# Patient Record
Sex: Male | Born: 1995 | Race: Black or African American | Hispanic: No | Marital: Single | Smoking: Current every day smoker
Health system: Southern US, Community
[De-identification: ages and names within clinical notes are randomized; demographics above are authoritative.]

## PROBLEM LIST (undated history)

## (undated) DIAGNOSIS — J45909 Unspecified asthma, uncomplicated: Secondary | ICD-10-CM

## (undated) DIAGNOSIS — L309 Dermatitis, unspecified: Secondary | ICD-10-CM

## (undated) DIAGNOSIS — F909 Attention-deficit hyperactivity disorder, unspecified type: Secondary | ICD-10-CM

---
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
## 2016-03-06 ENCOUNTER — Emergency Department (HOSPITAL_COMMUNITY)
Admission: EM | Admit: 2016-03-06 | Discharge: 2016-03-06 | Disposition: A | Discharge status: Home / Self Care | Payer: Medicaid Other | Attending: Emergency Medicine | Admitting: Emergency Medicine

## 2016-03-06 ENCOUNTER — Encounter (HOSPITAL_COMMUNITY): Payer: Self-pay

## 2016-03-06 DIAGNOSIS — F909 Attention-deficit hyperactivity disorder, unspecified type: Secondary | ICD-10-CM | POA: Insufficient documentation

## 2016-03-06 DIAGNOSIS — J069 Acute upper respiratory infection, unspecified: Secondary | ICD-10-CM | POA: Insufficient documentation

## 2016-03-06 DIAGNOSIS — F17210 Nicotine dependence, cigarettes, uncomplicated: Secondary | ICD-10-CM | POA: Insufficient documentation

## 2016-03-06 DIAGNOSIS — J45909 Unspecified asthma, uncomplicated: Secondary | ICD-10-CM | POA: Insufficient documentation

## 2016-03-06 HISTORY — DX: Unspecified asthma, uncomplicated: J45.909

## 2016-03-06 HISTORY — DX: Dermatitis, unspecified: L30.9

## 2016-03-06 HISTORY — DX: Attention-deficit hyperactivity disorder, unspecified type: F90.9

## 2016-03-06 MED ORDER — BENZONATATE 100 MG PO CAPS: 200.0000 mg | ORAL_CAPSULE | Freq: Two times a day (BID) | ORAL | 0 refills | Status: DC | PRN

## 2016-03-06 MED ORDER — CETIRIZINE HCL 10 MG PO TABS: 10.0000 mg | ORAL_TABLET | Freq: Every day | ORAL | 1 refills | Status: DC

## 2016-03-06 NOTE — ED Notes (Signed)
Called PT in Lobby to take them to the room, no answer. Called Three Times.

## 2016-03-06 NOTE — ED Triage Notes (Signed)
Onset 3 days non productive cough- intermittant, sneezing, runny nose, backache.  No fever.  No respiratory difficulties. No one in household sick.

## 2016-03-06 NOTE — ED Provider Notes (Signed)
[facility] Provider Note   CSN: [id number] Arrival date & time: 03/06/16  1755  By signing my name below, I, Doreatha MartinEva Mathews, attest that this documentation has been prepared under the direction and in the presence of Roxy Horsemanobert Justeen Hehr, [name]. Electronically Signed: Doreatha MartinEva Mathews, ED Scribe. 03/06/16. 7:24 PM.    History   Chief Complaint Chief Complaint  Patient presents with  . URI    HPI Aaron Lynn is a 21 y.o. male who presents to the Emergency Department complaining of worsening non-productive cough for 3 days with associated congestion, mild back pain, sore throat, rhinorrhea. He has taken ibuprofen for his back pain with some relief, but no other OTC medications for his URI symptoms. Pt states he typically gets back pain when he is sick. No worsening factors noted. Pt denies fever, generalized body aches.   The history is provided by the patient. No language interpreter was used.    Past Medical History:  Diagnosis Date  . ADHD   . Asthma   . Eczema     There are no active problems to display for this patient.   History reviewed. No pertinent surgical history.     Home Medications    Prior to Admission medications   Medication Sig Start Date End Date Taking? Authorizing Provider  benzonatate (TESSALON) 100 MG capsule Take 2 capsules (200 mg total) by mouth 2 (two) times daily as needed for cough. 03/06/16   Roxy Horsemanobert Galan Ghee, [name]  cetirizine (ZYRTEC ALLERGY) 10 MG tablet Take 1 tablet (10 mg total) by mouth daily. 03/06/16   Roxy Horsemanobert Shanya Ferriss, [name]  hydrOXYzine (ATARAX/VISTARIL) 25 MG tablet Take 25 mg by mouth daily.    Historical Provider, MD  ibuprofen (ADVIL,MOTRIN) 600 MG tablet Take 1 tablet (600 mg total) by mouth every 6 (six) hours as needed for pain. 10/03/11   Derwood KaplanAnkit Nanavati, MD  lisdexamfetamine (VYVANSE) 70 MG capsule Take 70 mg by mouth daily.    Historical Provider, MD  mometasone (NASONEX) 50 MCG/ACT nasal spray Place 2 sprays into the nose daily.     Historical Provider, MD  montelukast (SINGULAIR) 10 MG tablet Take 10 mg by mouth at bedtime.    Historical Provider, MD  olopatadine (PATANOL) 0.1 % ophthalmic solution Place 1 drop into both eyes 2 (two) times daily.    Historical Provider, MD    Family History History reviewed. No pertinent family history.  Social History Social History  Substance Use Topics  . Smoking status: Current Some Day Smoker    Types: Cigarettes  . Smokeless tobacco: Never Used  . Alcohol use Yes     Comment: occ     Allergies   Patient has no known allergies.   Review of Systems Review of Systems  Constitutional: Negative for fever.  HENT: Positive for congestion, rhinorrhea and sore throat.   Respiratory: Positive for cough.   Musculoskeletal: Positive for back pain. Negative for myalgias.     Physical Exam Updated Vital Signs BP 107/77   Pulse 85   Temp 99.3 F (37.4 C) (Oral)   Ht 5\' 11"  (1.803 m)   Wt 191 lb 3.2 oz (86.7 kg)   SpO2 97%   BMI 26.67 kg/m   Physical Exam Physical Exam  Constitutional: Pt  is oriented to person, place, and time. Appears well-developed and well-nourished. No distress.  HENT:  Head: Normocephalic and atraumatic.  Right Ear: Tympanic membrane, external ear and ear canal normal.  Left Ear: Tympanic membrane, external ear and ear canal normal.  Nose: Mucosal edema and mild rhinorrhea present. No epistaxis. Right sinus exhibits no maxillary sinus tenderness and no frontal sinus tenderness. Left sinus exhibits no maxillary sinus tenderness and no frontal sinus tenderness.  Mouth/Throat: Uvula is midline and mucous membranes are normal. Mucous membranes are not pale and not cyanotic. No oropharyngeal exudate, posterior oropharyngeal edema, posterior oropharyngeal erythema or tonsillar abscesses.  Eyes: Conjunctivae are normal. Pupils are equal, round, and reactive to light.  Neck: Normal range of motion and full passive range of motion without pain.    Cardiovascular: Normal rate and intact distal pulses.   Pulmonary/Chest: Effort normal and breath sounds normal. No stridor.  Clear and equal breath sounds without focal wheezes, rhonchi, rales  Abdominal: Soft. Bowel sounds are normal. There is no tenderness.  Musculoskeletal: Normal range of motion.  Lymphadenopathy:    Pthas no cervical adenopathy.  Neurological: Pt is alert and oriented to person, place, and time.  Skin: Skin is warm and dry. No rash noted. Pt is not diaphoretic.  Psychiatric: Normal mood and affect.  Nursing note and vitals reviewed.    ED Treatments / Results   DIAGNOSTIC STUDIES: Oxygen Saturation is 97% on RA, normal by my interpretation.    COORDINATION OF CARE: 7:21 PM Discussed treatment plan with pt at bedside which includes symptomatic therapy and pt agreed to plan.    Labs (all labs ordered are listed, but only abnormal results are displayed) Labs Reviewed - No data to display  EKG  EKG Interpretation None       Radiology No results found.  Procedures Procedures (including critical care time)  Medications Ordered in ED Medications - No data to display   Initial Impression / Assessment and Plan / ED Course  I have reviewed the triage vital signs and the nursing notes.  Pertinent labs & imaging results that were available during my care of the patient were reviewed by me and considered in my medical decision making (see chart for details).     Pt symptoms consistent with URI. Pt will be discharged with symptomatic treatment, including antitussive, antihistamine.  Discussed return precautions.  Pt is hemodynamically stable & in NAD prior to discharge.   Afebrile, VSS.  States here for work note.  Final Clinical Impressions(s) / ED Diagnoses   Final diagnoses:  Upper respiratory tract infection, unspecified type    New Prescriptions New Prescriptions   BENZONATATE (TESSALON) 100 MG CAPSULE    Take 2 capsules (200 mg total)  by mouth 2 (two) times daily as needed for cough.   CETIRIZINE (ZYRTEC ALLERGY) 10 MG TABLET    Take 1 tablet (10 mg total) by mouth daily.    I personally performed the services described in this documentation, which was scribed in my presence. The recorded information has been reviewed and is accurate.      Roxy Horseman, [name] 03/06/16 1925    Cathren Laine, MD 03/06/16 [phone]

## 2016-03-06 NOTE — ED Notes (Signed)
Pt and family understood dc material. NAD noted 

## 2016-09-02 ENCOUNTER — Ambulatory Visit (HOSPITAL_COMMUNITY)
Admission: EM | Admit: 2016-09-02 | Discharge: 2016-09-02 | Disposition: A | Discharge status: Home / Self Care | Payer: Medicaid Other | Attending: Family | Admitting: Family

## 2016-09-02 ENCOUNTER — Encounter (HOSPITAL_COMMUNITY): Payer: Self-pay | Admitting: Emergency Medicine

## 2016-09-02 DIAGNOSIS — K529 Noninfective gastroenteritis and colitis, unspecified: Secondary | ICD-10-CM

## 2016-09-02 NOTE — Discharge Instructions (Signed)
As is discussed, suspect you either ate something last night late that has disagreed with you or you have a viral gastroenteritis. In either case, please stay very vigilant and ensure you do not have recurring diarrhea, vomiting, or start tohave abdominal pain, fever as all these signs would mea you need to come back for reevaluation.  As for today, please increase  diet as tolerated including bland foods as we discussed. Importance of increased fluids as well including Pedialyte, Gatorade, water  If there is no improvement in your symptoms, or if there is any worsening of symptoms, or if you have any additional concerns, please return for re-evaluation; or, if we are closed, consider going to the Emergency Room for evaluation if symptoms urgent.

## 2016-09-02 NOTE — ED Triage Notes (Signed)
Pt reports waking up with diarrhea early this morning.  He has had at least 5 episodes of diarrhea and 2 episodes of vomiting since 0730 this morning.  Pt states his girlfriend is suffering from the same symptoms.

## 2016-09-02 NOTE — ED Provider Notes (Signed)
[facility]    CSN: [id number] Arrival date & time: 09/02/16  1151     History   Chief Complaint Chief Complaint  Patient presents with  . Emesis  . Diarrhea    HPI Aaron Lynn is a 21 y.o. male.   Chief complaint of 2 episodes of diarrhea this morning, watery brown, improving. One episode of vomiting, undigested food. Nonbloody. Ate tacos ( homemade) yesterday. Pepto bismal with some relief.  No fever, abdominal pain, dysuria.   drinking well- water and juice.  Notes that girlfriend has similar symptoms.   No alcohol or drugs.         Past Medical History:  Diagnosis Date  . ADHD   . Asthma   . Eczema     There are no active problems to display for this patient.   History reviewed. No pertinent surgical history.     Home Medications    Prior to Admission medications   Medication Sig Start Date End Date Taking? Authorizing Provider  benzonatate (TESSALON) 100 MG capsule Take 2 capsules (200 mg total) by mouth 2 (two) times daily as needed for cough. 03/06/16   Roxy Horseman, [name]  cetirizine (ZYRTEC ALLERGY) 10 MG tablet Take 1 tablet (10 mg total) by mouth daily. 03/06/16   Roxy Horseman, [name]  hydrOXYzine (ATARAX/VISTARIL) 25 MG tablet Take 25 mg by mouth daily.    [provider]  ibuprofen (ADVIL,MOTRIN) 600 MG tablet Take 1 tablet (600 mg total) by mouth every 6 (six) hours as needed for pain. 10/03/11   Derwood Kaplan, MD  lisdexamfetamine (VYVANSE) 70 MG capsule Take 70 mg by mouth daily.    [provider]  mometasone (NASONEX) 50 MCG/ACT nasal spray Place 2 sprays into the nose daily.    [provider]  montelukast (SINGULAIR) 10 MG tablet Take 10 mg by mouth at bedtime.    [provider]  olopatadine (PATANOL) 0.1 % ophthalmic solution Place 1 drop into both eyes 2 (two) times daily.    [provider]    Family History History reviewed. No pertinent family history.  Social  History Social History  Substance Use Topics  . Smoking status: Current Some Day Smoker    Types: Cigarettes  . Smokeless tobacco: Never Used  . Alcohol use Yes     Comment: occ     Allergies   Patient has no known allergies.   Review of Systems Review of Systems  Constitutional: Negative for chills and fever.  Respiratory: Negative for cough.   Cardiovascular: Negative for chest pain and palpitations.  Gastrointestinal: Positive for diarrhea and vomiting. Negative for abdominal pain, constipation and nausea.  Genitourinary: Negative for difficulty urinating.     Physical Exam Triage Vital Signs ED Triage Vitals [09/02/16 1241]  Enc Vitals Group     BP 95/74     Pulse Rate 87     Resp      Temp 98.7 F (37.1 C)     Temp Source Oral     SpO2 98 %     Weight      Height      Head Circumference      Peak Flow      Pain Score      Pain Loc      Pain Edu?      Excl. in GC?    No data found.   Updated Vital Signs BP 95/74 (BP Location: Left Arm)   Pulse 87  Temp 98.7 F (37.1 C) (Oral)   SpO2 98%   Visual Acuity Right Eye Distance:   Left Eye Distance:   Bilateral Distance:    Right Eye Near:   Left Eye Near:    Bilateral Near:     Physical Exam  Constitutional: He appears well-developed and well-nourished.  Cardiovascular: Regular rhythm and normal heart sounds.   Pulmonary/Chest: Effort normal and breath sounds normal. No respiratory distress. He has no wheezes. He has no rhonchi. He has no rales.  Abdominal: There is no CVA tenderness.  No suprapubic tenderness.   Neurological: He is alert.  Skin: Skin is warm and dry.  Psychiatric: He has a normal mood and affect. His speech is normal and behavior is normal.  Vitals reviewed.    UC Treatments / Results  Labs (all labs ordered are listed, but only abnormal results are displayed) Labs Reviewed - No data to display  EKG  EKG Interpretation None       Radiology No results  found.  Procedures Procedures (including critical care time)  Medications Ordered in UC Medications - No data to display   Initial Impression / Assessment and Plan / UC Course  I have reviewed the triage vital signs and the nursing notes.  Pertinent labs & imaging results that were available during my care of the patient were reviewed by me and considered in my medical decision making (see chart for details).      Final Clinical Impressions(s) / UC Diagnoses   Final diagnoses:  Noninfectious gastroenteritis, unspecified type  Working diagnosis of viral gastroenteritis. Patient is well-appearing and afebrile. Reassured by benign abdominal exam and improvement of symptoms overall since this morning. Encouraged bland diet, and advance as tolerated. Plenty of fluids. Return precautions given.  New Prescriptions New Prescriptions   No medications on file     Controlled Substance Prescriptions [place] Controlled Substance Registry consulted? Not Applicable   Allegra Grana, FNP 09/02/16 1407

## 2017-02-01 ENCOUNTER — Other Ambulatory Visit: Payer: Self-pay

## 2017-02-01 ENCOUNTER — Encounter (HOSPITAL_COMMUNITY): Payer: Self-pay | Admitting: Emergency Medicine

## 2017-02-01 ENCOUNTER — Emergency Department (HOSPITAL_COMMUNITY)
Admission: EM | Admit: 2017-02-01 | Discharge: 2017-02-01 | Disposition: A | Discharge status: Home / Self Care | Payer: Self-pay | Attending: Emergency Medicine | Admitting: Emergency Medicine

## 2017-02-01 DIAGNOSIS — F17210 Nicotine dependence, cigarettes, uncomplicated: Secondary | ICD-10-CM | POA: Insufficient documentation

## 2017-02-01 DIAGNOSIS — F909 Attention-deficit hyperactivity disorder, unspecified type: Secondary | ICD-10-CM | POA: Insufficient documentation

## 2017-02-01 DIAGNOSIS — K047 Periapical abscess without sinus: Secondary | ICD-10-CM | POA: Insufficient documentation

## 2017-02-01 DIAGNOSIS — J45909 Unspecified asthma, uncomplicated: Secondary | ICD-10-CM | POA: Insufficient documentation

## 2017-02-01 DIAGNOSIS — Z79899 Other long term (current) drug therapy: Secondary | ICD-10-CM | POA: Insufficient documentation

## 2017-02-01 MED ORDER — NAPROXEN 500 MG PO TABS: 500.0000 mg | ORAL_TABLET | Freq: Two times a day (BID) | ORAL | 0 refills | Status: DC

## 2017-02-01 MED ORDER — AMOXICILLIN 500 MG PO CAPS: 500.0000 mg | ORAL_CAPSULE | Freq: Three times a day (TID) | ORAL | 0 refills | Status: DC

## 2017-02-01 NOTE — ED Triage Notes (Signed)
Pt to ER for evaluation of right lower dental pain onset "years" ago, states "I was in jail and I think they gave me fake penicillin because it didn't work." pt states this morning he had swelling to right face, took some aspirin and it went away. Pt in NAD

## 2017-02-01 NOTE — Discharge Instructions (Signed)
You will need to call the dentist tomorrow and tell them you were seen in the ED and need follow up.

## 2017-02-01 NOTE — ED Provider Notes (Signed)
[facility]GENCY [facility]MENT Provider Note   CSN: [id number] Arrival date & time: 02/01/17  1411     History   Chief Complaint Chief Complaint  Patient presents with  . Dental Pain    HPI Aaron Lynn is a 22 y.o. male who presents to the ED with dental pain. The dental problems started years ago. Patient reports he was in jail 6 months ago and c/o pain and he thinks they gave him "fake" penicillin because it did not work. Patient now c/o swelling to the right side of his face. Patient reports taking ASA and swelling went away but still has dental pain. He states that there was a swollen area on the gum that drained yellow drainage and the swelling went down after that.   HPI  Past Medical History:  Diagnosis Date  . ADHD   . Asthma   . Eczema     There are no active problems to display for this patient.   History reviewed. No pertinent surgical history.     Home Medications    Prior to Admission medications   Medication Sig Start Date End Date Taking? Authorizing Provider  amoxicillin (AMOXIL) 500 MG capsule Take 1 capsule (500 mg total) by mouth 3 (three) times daily. 02/01/17   [name], Dionte Blaustein M, NP  benzonatate (TESSALON) 100 MG capsule Take 2 capsules (200 mg total) by mouth 2 (two) times daily as needed for cough. 03/06/16   [name], [name], [name]  cetirizine (ZYRTEC ALLERGY) 10 MG tablet Take 1 tablet (10 mg total) by mouth daily. 03/06/16   [name], [name], [name]  hydrOXYzine (ATARAX/VISTARIL) 25 MG tablet Take 25 mg by mouth daily.    [provider]  lisdexamfetamine (VYVANSE) 70 MG capsule Take 70 mg by mouth daily.    [provider]  mometasone (NASONEX) 50 MCG/ACT nasal spray Place 2 sprays into the nose daily.    [provider]  montelukast (SINGULAIR) 10 MG tablet Take 10 mg by mouth at bedtime.    [provider]  naproxen (NAPROSYN) 500 MG tablet Take 1 tablet (500 mg total) by mouth 2 (two) times  daily. 02/01/17   [name], Nickolai Rinks M, NP  olopatadine (PATANOL) 0.1 % ophthalmic solution Place 1 drop into both eyes 2 (two) times daily.    [provider]    Family History History reviewed. No pertinent family history.  Social History Social History   Tobacco Use  . Smoking status: Current Some Day Smoker    Types: Cigarettes  . Smokeless tobacco: Never Used  Substance Use Topics  . Alcohol use: Yes    Comment: occ  . Drug use: Yes    Types: Marijuana     Allergies   Patient has no known allergies.   Review of Systems Review of Systems  Constitutional: Negative for chills and fever.  HENT: Positive for dental problem and facial swelling. Negative for drooling, sore throat and trouble swallowing.   Eyes: Negative for visual disturbance.  Respiratory: Negative for cough and shortness of breath.   Cardiovascular: Negative for chest pain.  Gastrointestinal: Negative for diarrhea, nausea and vomiting.  Genitourinary: Negative for dysuria and frequency.  Musculoskeletal: Negative for neck pain and neck stiffness.  Skin: Negative for rash.  Neurological: Negative for headaches.  Hematological: Positive for adenopathy.  Psychiatric/Behavioral: Negative for confusion.     Physical Exam Updated Vital Signs BP (!) 133/53 (BP Location: Right Arm)   Pulse 66   Temp 98.4 F (36.9  C) (Oral)   Resp 16   SpO2 100%   Physical Exam  Constitutional: He is oriented to person, place, and time. He appears well-developed and well-nourished. No distress.  HENT:  Head: Normocephalic.  Mouth/Throat: Uvula is midline and oropharynx is clear and moist. Dental abscesses present.    There is a tender raised area to the gum surrounding the right lower first molar that is draining.   Eyes: Conjunctivae and EOM are normal. Pupils are equal, round, and reactive to light.  Neck: Normal range of motion. Neck supple.  Cardiovascular: Normal rate and regular rhythm.  Pulmonary/Chest:  Effort normal and breath sounds normal.  Abdominal: Soft. There is no tenderness.  Musculoskeletal: Normal range of motion.  Lymphadenopathy:    Cervical adenopathy: right.  Neurological: He is alert and oriented to person, place, and time. No cranial nerve deficit.  Skin: Skin is warm and dry.  Psychiatric: He has a normal mood and affect.  Nursing note and vitals reviewed.    ED Treatments / Results  Labs (all labs ordered are listed, but only abnormal results are displayed) Labs Reviewed - No data to display Radiology No results found.  Procedures Procedures (including critical care time)  Medications Ordered in ED Medications - No data to display   Initial Impression / Assessment and Plan / ED Course  I have reviewed the triage vital signs and the nursing notes. Patient with toothache.  No gross abscess.  Exam unconcerning for Ludwig's angina or spread of infection.  Will treat with penicillin and anti-inflammatories medicine.  Urged patient to follow-up with dentist.  Referral given.  Final Clinical Impressions(s) / ED Diagnoses   Final diagnoses:  Dental abscess    ED Discharge Orders        Ordered    amoxicillin (AMOXIL) 500 MG capsule  3 times daily     02/01/17 1635    naproxen (NAPROSYN) 500 MG tablet  2 times daily     02/01/17 [address] [place], [place] 02/01/17 1641    Vanetta Mulders, MD 02/01/17 1718

## 2017-07-05 ENCOUNTER — Encounter: Payer: Self-pay | Admitting: Emergency Medicine

## 2017-07-05 ENCOUNTER — Emergency Department: Payer: Self-pay

## 2017-07-05 ENCOUNTER — Emergency Department
Admission: EM | Admit: 2017-07-05 | Discharge: 2017-07-05 | Disposition: A | Discharge status: Home / Self Care | Payer: Self-pay | Attending: Emergency Medicine | Admitting: Emergency Medicine

## 2017-07-05 ENCOUNTER — Other Ambulatory Visit: Payer: Self-pay

## 2017-07-05 DIAGNOSIS — S56429A Laceration of extensor muscle, fascia and tendon of unspecified finger at forearm level, initial encounter: Secondary | ICD-10-CM

## 2017-07-05 DIAGNOSIS — S61206A Unspecified open wound of right little finger without damage to nail, initial encounter: Secondary | ICD-10-CM | POA: Insufficient documentation

## 2017-07-05 DIAGNOSIS — Y929 Unspecified place or not applicable: Secondary | ICD-10-CM | POA: Insufficient documentation

## 2017-07-05 DIAGNOSIS — Z79899 Other long term (current) drug therapy: Secondary | ICD-10-CM | POA: Insufficient documentation

## 2017-07-05 DIAGNOSIS — F17210 Nicotine dependence, cigarettes, uncomplicated: Secondary | ICD-10-CM | POA: Insufficient documentation

## 2017-07-05 DIAGNOSIS — J45909 Unspecified asthma, uncomplicated: Secondary | ICD-10-CM | POA: Insufficient documentation

## 2017-07-05 DIAGNOSIS — Y999 Unspecified external cause status: Secondary | ICD-10-CM | POA: Insufficient documentation

## 2017-07-05 DIAGNOSIS — Y9389 Activity, other specified: Secondary | ICD-10-CM | POA: Insufficient documentation

## 2017-07-05 DIAGNOSIS — S56427A Laceration of extensor muscle, fascia and tendon of right little finger at forearm level, initial encounter: Secondary | ICD-10-CM | POA: Insufficient documentation

## 2017-07-05 DIAGNOSIS — L03113 Cellulitis of right upper limb: Secondary | ICD-10-CM

## 2017-07-05 DIAGNOSIS — S61209A Unspecified open wound of unspecified finger without damage to nail, initial encounter: Secondary | ICD-10-CM

## 2017-07-05 LAB — BASIC METABOLIC PANEL
Anion gap: 11 (ref 5–15)
BUN: 16 mg/dL (ref 6–20)
CALCIUM: 9.2 mg/dL (ref 8.9–10.3)
CO2: 21 mmol/L — ABNORMAL LOW (ref 22–32)
CREATININE: 1.03 mg/dL (ref 0.61–1.24)
Chloride: 104 mmol/L (ref 98–111)
GFR calc Af Amer: >60 mL/min (ref >60)
GFR calc non Af Amer: >60 mL/min (ref >60)
GLUCOSE: 89 mg/dL (ref 70–99)
POTASSIUM: 4.1 mmol/L (ref 3.5–5.1)
SODIUM: 136 mmol/L (ref 135–145)

## 2017-07-05 LAB — CBC WITH DIFFERENTIAL/PLATELET
BASOS ABS: 0 10*3/uL (ref 0–0.1)
BASOS PCT: 1 %
EOS ABS: 0 10*3/uL (ref 0–0.7)
Eosinophils Relative: 0 %
HCT: 46.9 % (ref 40.0–52.0)
Hemoglobin: 16.4 g/dL (ref 13.0–18.0)
LYMPHS PCT: 16 %
Lymphs Abs: 1.4 10*3/uL (ref 1.0–3.6)
MCH: 33.7 pg (ref 26.0–34.0)
MCHC: 34.9 g/dL (ref 32.0–36.0)
MCV: 96.7 fL (ref 80.0–100.0)
MONO ABS: 0.6 10*3/uL (ref 0.2–1.0)
Monocytes Relative: 7 %
Neutro Abs: 6.7 10*3/uL — ABNORMAL HIGH (ref 1.4–6.5)
Neutrophils Relative %: 76 %
PLATELETS: 263 10*3/uL (ref 150–440)
RBC: 4.85 MIL/uL (ref 4.40–5.90)
RDW: 13.6 % (ref 11.5–14.5)
WBC: 8.8 10*3/uL (ref 3.8–10.6)

## 2017-07-05 MED ORDER — AMPICILLIN-SULBACTAM SODIUM 3 (2-1) G IJ SOLR
3.0000 g | Freq: Once | INTRAMUSCULAR | Status: AC
  Administered 2017-07-05: 3 g via INTRAVENOUS
  Filled 2017-07-05: qty 3

## 2017-07-05 MED ORDER — TETANUS-DIPHTH-ACELL PERTUSSIS 5-2.5-18.5 LF-MCG/0.5 IM SUSP
0.5000 mL | Freq: Once | INTRAMUSCULAR | Status: AC
  Administered 2017-07-05: 0.5 mL via INTRAMUSCULAR
  Filled 2017-07-05: qty 0.5

## 2017-07-05 MED ORDER — TRAMADOL HCL 50 MG PO TABS: 50.0000 mg | ORAL_TABLET | Freq: Three times a day (TID) | ORAL | 0 refills | Status: AC | PRN

## 2017-07-05 MED ORDER — AMOXICILLIN-POT CLAVULANATE 875-125 MG PO TABS: 1.0000 | ORAL_TABLET | Freq: Two times a day (BID) | ORAL | 0 refills | Status: DC

## 2017-07-05 MED ORDER — LIDOCAINE HCL (PF) 1 % IJ SOLN
5.0000 mL | Freq: Once | INTRAMUSCULAR | Status: AC
  Administered 2017-07-05: 5 mL
  Filled 2017-07-05: qty 5

## 2017-07-05 MED ORDER — TRAMADOL HCL 50 MG PO TABS
50.0000 mg | ORAL_TABLET | Freq: Once | ORAL | Status: AC
Start: 2017-07-05 — End: 2017-07-05
  Administered 2017-07-05: 50 mg via ORAL
  Filled 2017-07-05: qty 1
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
## 2017-07-05 NOTE — Discharge Instructions (Addendum)
Keep the wound clean, dry, and covered. Wear the splint until you are evaluated by ortho. Take the antibiotic as directed. Return for any signs of worsening infection.

## 2017-07-05 NOTE — ED Notes (Signed)
Pt ambulatory to POV without difficulty. VSS. NAD. Discharge instructions, RX, follow up reviewed. All questions addressed.  
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
## 2018-02-04 ENCOUNTER — Emergency Department
Admission: EM | Admit: 2018-02-04 | Discharge: 2018-02-04 | Disposition: A | Discharge status: Home / Self Care | Payer: Self-pay

## 2019-02-01 ENCOUNTER — Emergency Department: Payer: No Typology Code available for payment source

## 2019-02-01 ENCOUNTER — Other Ambulatory Visit: Payer: Self-pay

## 2019-02-01 ENCOUNTER — Emergency Department
Admission: EM | Admit: 2019-02-01 | Discharge: 2019-02-01 | Disposition: A | Discharge status: Home / Self Care | Payer: No Typology Code available for payment source | Attending: Emergency Medicine | Admitting: Emergency Medicine

## 2019-02-01 DIAGNOSIS — M4847XA Fatigue fracture of vertebra, lumbosacral region, initial encounter for fracture: Secondary | ICD-10-CM

## 2019-02-01 DIAGNOSIS — R0789 Other chest pain: Secondary | ICD-10-CM | POA: Insufficient documentation

## 2019-02-01 DIAGNOSIS — F17210 Nicotine dependence, cigarettes, uncomplicated: Secondary | ICD-10-CM | POA: Insufficient documentation

## 2019-02-01 DIAGNOSIS — M549 Dorsalgia, unspecified: Secondary | ICD-10-CM | POA: Insufficient documentation

## 2019-02-01 DIAGNOSIS — Y9389 Activity, other specified: Secondary | ICD-10-CM | POA: Insufficient documentation

## 2019-02-01 DIAGNOSIS — R109 Unspecified abdominal pain: Secondary | ICD-10-CM | POA: Insufficient documentation

## 2019-02-01 DIAGNOSIS — Y999 Unspecified external cause status: Secondary | ICD-10-CM | POA: Insufficient documentation

## 2019-02-01 DIAGNOSIS — M8438XA Stress fracture, other site, initial encounter for fracture: Secondary | ICD-10-CM | POA: Diagnosis not present

## 2019-02-01 DIAGNOSIS — Y92410 Unspecified street and highway as the place of occurrence of the external cause: Secondary | ICD-10-CM | POA: Insufficient documentation

## 2019-02-01 DIAGNOSIS — M7918 Myalgia, other site: Secondary | ICD-10-CM | POA: Diagnosis present

## 2019-02-01 DIAGNOSIS — M542 Cervicalgia: Secondary | ICD-10-CM | POA: Diagnosis not present

## 2019-02-01 MED ORDER — IOHEXOL 300 MG/ML  SOLN
100.0000 mL | Freq: Once | INTRAMUSCULAR | Status: AC | PRN
  Administered 2019-02-01: 100 mL via INTRAVENOUS

## 2019-02-01 MED ORDER — IBUPROFEN 800 MG PO TABS
800.0000 mg | ORAL_TABLET | Freq: Once | ORAL | Status: AC
  Administered 2019-02-01: 07:00:00 800 mg via ORAL
  Filled 2019-02-01: qty 1

## 2019-02-01 NOTE — ED Notes (Signed)
Reviewed discharge instructions, follow-up care, use of ice, and OTC pain relievers with patient. Patient verbalized understanding of all information reviewed. Patient stable, with no distress noted at this time.
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
## 2019-02-01 NOTE — ED Notes (Signed)
C-collar in place

## 2019-04-03 ENCOUNTER — Emergency Department: Payer: Self-pay

## 2019-04-03 ENCOUNTER — Other Ambulatory Visit: Payer: Self-pay

## 2019-04-03 ENCOUNTER — Emergency Department
Admission: EM | Admit: 2019-04-03 | Discharge: 2019-04-03 | Disposition: A | Discharge status: Home / Self Care | Payer: Self-pay | Attending: Emergency Medicine | Admitting: Emergency Medicine

## 2019-04-03 DIAGNOSIS — S8992XA Unspecified injury of left lower leg, initial encounter: Secondary | ICD-10-CM | POA: Insufficient documentation

## 2019-04-03 DIAGNOSIS — T148XXA Other injury of unspecified body region, initial encounter: Secondary | ICD-10-CM

## 2019-04-03 DIAGNOSIS — Z79899 Other long term (current) drug therapy: Secondary | ICD-10-CM | POA: Insufficient documentation

## 2019-04-03 DIAGNOSIS — F1210 Cannabis abuse, uncomplicated: Secondary | ICD-10-CM | POA: Insufficient documentation

## 2019-04-03 DIAGNOSIS — Y9367 Activity, basketball: Secondary | ICD-10-CM | POA: Insufficient documentation

## 2019-04-03 DIAGNOSIS — M238X9 Other internal derangements of unspecified knee: Secondary | ICD-10-CM | POA: Insufficient documentation

## 2019-04-03 DIAGNOSIS — S82122A Displaced fracture of lateral condyle of left tibia, initial encounter for closed fracture: Secondary | ICD-10-CM | POA: Insufficient documentation

## 2019-04-03 DIAGNOSIS — X509XXA Other and unspecified overexertion or strenuous movements or postures, initial encounter: Secondary | ICD-10-CM | POA: Insufficient documentation

## 2019-04-03 DIAGNOSIS — F17210 Nicotine dependence, cigarettes, uncomplicated: Secondary | ICD-10-CM | POA: Insufficient documentation

## 2019-04-03 DIAGNOSIS — Y998 Other external cause status: Secondary | ICD-10-CM | POA: Insufficient documentation

## 2019-04-03 DIAGNOSIS — J45909 Unspecified asthma, uncomplicated: Secondary | ICD-10-CM | POA: Insufficient documentation

## 2019-04-03 DIAGNOSIS — Y92310 Basketball court as the place of occurrence of the external cause: Secondary | ICD-10-CM | POA: Insufficient documentation

## 2019-04-03 MED ORDER — MELOXICAM 15 MG PO TABS: 15.0000 mg | ORAL_TABLET | Freq: Every day | ORAL | 0 refills | Status: DC

## 2019-04-03 NOTE — ED Triage Notes (Signed)
Pt was playing basketball yesterday when he jumped and felt pop to left knee as he landed. Has been ambulatory with pain.

## 2019-04-03 NOTE — ED Provider Notes (Signed)
[facility] Emergency Department Provider Note  ____________________________________________  Time seen: Approximately 9:42 PM  I have reviewed the triage vital signs and the nursing notes.   HISTORY  Chief Complaint No chief complaint on file.    HPI Aaron Lynn is a 24 y.o. male who presents the emergency department complaining of any injury.  Patient states that he was playing basketball yesterday, landed awkwardly with his knee moving inward.  Patient states that he is having pain, swelling to the knee.  Patient states that he had a similar injury 4 months ago, states that he had been taking it easy and this was the first time he was actually going back to full speed while playing basketball.  Patient states that he is able to ambulate but doing so increases his pain.  No other injury or complaint.  Patient does not try any medications for this prior to arrival.  No history other than 4 months ago of previous knee injuries.         Past Medical History:  Diagnosis Date  . ADHD   . Asthma   . Eczema     There are no problems to display for this patient.   No past surgical history on file.  Prior to Admission medications   Medication Sig Start Date End Date Taking? Authorizing Provider  amoxicillin (AMOXIL) 500 MG capsule Take 1 capsule (500 mg total) by mouth 3 (three) times daily. 02/01/17   Ashley Murrain, NP  amoxicillin-clavulanate (AUGMENTIN) 875-125 MG tablet Take 1 tablet by mouth 2 (two) times daily. 07/05/17   [name], Dannielle Karvonen, [name]  benzonatate (TESSALON) 100 MG capsule Take 2 capsules (200 mg total) by mouth 2 (two) times daily as needed for cough. 03/06/16   Montine Circle, [name]  cetirizine (ZYRTEC ALLERGY) 10 MG tablet Take 1 tablet (10 mg total) by mouth daily. 03/06/16   Montine Circle, [name]  hydrOXYzine (ATARAX/VISTARIL) 25 MG tablet Take 25 mg by mouth daily.    [provider]  lisdexamfetamine (VYVANSE) 70 MG  capsule Take 70 mg by mouth daily.    [provider]  meloxicam (MOBIC) 15 MG tablet Take 1 tablet (15 mg total) by mouth daily. 04/03/19   Nickholas Goldston, Charline Bills, [name]  mometasone (NASONEX) 50 MCG/ACT nasal spray Place 2 sprays into the nose daily.    [provider]  montelukast (SINGULAIR) 10 MG tablet Take 10 mg by mouth at bedtime.    [provider]  naproxen (NAPROSYN) 500 MG tablet Take 1 tablet (500 mg total) by mouth 2 (two) times daily. 02/01/17   Ashley Murrain, NP  olopatadine (PATANOL) 0.1 % ophthalmic solution Place 1 drop into both eyes 2 (two) times daily.    [provider]    Allergies Patient has no known allergies.  No family history on file.  Social History Social History   Tobacco Use  . Smoking status: Current Some Day Smoker    Types: Cigarettes  . Smokeless tobacco: Never Used  Substance Use Topics  . Alcohol use: Yes    Comment: occ  . Drug use: Yes    Types: Marijuana     Review of Systems  Constitutional: No fever/chills Eyes: No visual changes. No discharge ENT: No upper respiratory complaints. Cardiovascular: no chest pain. Respiratory: no cough. No SOB. Gastrointestinal: No abdominal pain.  No nausea, no vomiting.  No diarrhea.  No constipation. Musculoskeletal: Left knee pain/injury Skin: Negative for rash, abrasions, lacerations, ecchymosis. Neurological:  Negative for headaches, focal weakness or numbness. 10-point ROS otherwise negative.  ____________________________________________   PHYSICAL EXAM:  VITAL SIGNS: ED Triage Vitals [04/03/19 1913]  Enc Vitals Group     BP (!) 144/73     Pulse Rate 97     Resp 20     Temp 98.9 F (37.2 C)     Temp Source Oral     SpO2 97 %     Weight 227 lb (103 kg)     Height 5\' 11"  (1.803 m)     Head Circumference      Peak Flow      Pain Score 5     Pain Loc      Pain Edu?      Excl. in GC?      Constitutional: Alert and oriented. Well appearing and  in no acute distress. Eyes: Conjunctivae are normal. PERRL. EOMI. Head: Atraumatic. ENT:      Ears:       Nose: No congestion/rhinnorhea.      Mouth/Throat: Mucous membranes are moist.  Neck: No stridor.    Cardiovascular: Normal rate, regular rhythm. Normal S1 and S2.  Good peripheral circulation. Respiratory: Normal respiratory effort without tachypnea or retractions. Lungs CTAB. Good air entry to the bases with no decreased or absent breath sounds. Musculoskeletal: Full range of motion to all extremities. No gross deformities appreciated.  Visualization of the left knee reveals edema in the suprapatellar region when compared with right.  No deformity.  No erythema.  No overlying soft tissue injuries such as lacerations or abrasions.  Patient is relatively nontender to palpation throughout the exam.  Patient does have laxity on the valgus stress test.  Varus, Lachman's, McMurray's is negative.  Dorsalis pedis pulses sensation intact distally. Neurologic:  Normal speech and language. No gross focal neurologic deficits are appreciated.  Skin:  Skin is warm, dry and intact. No rash noted. Psychiatric: Mood and affect are normal. Speech and behavior are normal. Patient exhibits appropriate insight and judgement.   ____________________________________________   LABS (all labs ordered are listed, but only abnormal results are displayed)  Labs Reviewed - No data to display ____________________________________________  EKG   ____________________________________________  RADIOLOGY I personally viewed and evaluated these images as part of my medical decision making, as well as reviewing the written report by the radiologist.  DG Knee Complete 4 Views Left  Result Date: 04/03/2019 CLINICAL DATA:  24 year old male with trauma to the left knee. EXAM: LEFT KNEE - COMPLETE 4+ VIEW COMPARISON:  None. FINDINGS: There is a small bone fragment lateral to the lateral tibial plateau, likely  representing a mildly displaced cortical avulsion injury. No other acute fracture identified. There is no dislocation. No arthritic changes. There is large suprapatellar effusion. The soft tissues are otherwise grossly unremarkable. IMPRESSION: 1. Mildly displaced cortical avulsion injury of the lateral tibial plateau. 2. Large suprapatellar effusion. Electronically Signed   By: 22 M.D.   On: 04/03/2019 19:30    ____________________________________________    PROCEDURES  Procedure(s) performed:    Procedures    Medications - No data to display   ____________________________________________   INITIAL IMPRESSION / ASSESSMENT AND PLAN / ED COURSE  Pertinent labs & imaging results that were available during my care of the patient were reviewed by me and considered in my medical decision making (see chart for details).  Review of the [place] CSRS was performed in accordance of the [facility] prior to dispensing any controlled drugs.  Patient's diagnosis is consistent with knee injury, likely medial collateral ligament injury.  Patient sustained an injury 4 months ago, had been rehabbing at home with over-the-counter medications and taking it easy.  When patient first returned to full sports yesterday, he landed awkwardly causing what likely would be a medial knee injury based on reported injury.  Imaging reveals that Patient had a small avulsion fracture to the lateral tibial plateau.  No evidence of tibial plateau fracture.  Patient did have findings on both x-ray and physical exam consistent with suprapatellar effusion.  Given the laxity found on exam, I am concerned for a medial collateral ligament injury.  Patient was placed in knee immobilizer, given crutches for ambulation.  We placed on meloxicam and referred to orthopedics for further evaluation.. Patient is given ED precautions to return to the ED for any worsening or new  symptoms.     ____________________________________________  FINAL CLINICAL IMPRESSION(S) / ED DIAGNOSES  Final diagnoses:  Injury of left knee, initial encounter  Lax MCL  Avulsion fracture      NEW MEDICATIONS STARTED DURING THIS VISIT:  ED Discharge Orders         Ordered    meloxicam (MOBIC) 15 MG tablet  Daily     04/03/19 2153              This chart was dictated using voice recognition software/Dragon. Despite best efforts to proofread, errors can occur which can change the meaning. Any change was purely unintentional.    Lanette Hampshire 04/03/19 2154    Phineas Semen, MD 04/03/19 [phone]

## 2019-10-08 ENCOUNTER — Other Ambulatory Visit: Payer: Self-pay

## 2021-05-28 ENCOUNTER — Encounter (HOSPITAL_COMMUNITY): Payer: Self-pay | Admitting: Emergency Medicine

## 2021-05-28 ENCOUNTER — Ambulatory Visit (INDEPENDENT_AMBULATORY_CARE_PROVIDER_SITE_OTHER): Payer: Self-pay

## 2021-05-28 ENCOUNTER — Ambulatory Visit (HOSPITAL_COMMUNITY)
Admission: EM | Admit: 2021-05-28 | Discharge: 2021-05-28 | Disposition: A | Discharge status: Home / Self Care | Payer: Self-pay | Attending: Student | Admitting: Student

## 2021-05-28 DIAGNOSIS — S83421A Sprain of lateral collateral ligament of right knee, initial encounter: Secondary | ICD-10-CM

## 2021-05-28 DIAGNOSIS — M25561 Pain in right knee: Secondary | ICD-10-CM

## 2021-05-28 NOTE — ED Triage Notes (Signed)
Pt reports right knee pain after playing basketball yesterday. States someone fell on his knee.

## 2021-05-28 NOTE — ED Provider Notes (Signed)
[facility]    CSN: [id number] Arrival date & time: 05/28/21  1332      History   Chief Complaint Chief Complaint  Patient presents with   Knee Pain    HPI Aaron Lynn is a 26 y.o. male presenting with right knee pain following playing basketball 1 day ago.  History noncontributory, denies prior injury to the right knee in the past.  Vague complaint of another player landing against the lateral knee, patient states he did not fall.  States he is having discomfort with walking, but no instability.  Denies radiation of pain or sensation changes.  Has not attempted interventions at home.  HPI  Past Medical History:  Diagnosis Date   ADHD    Asthma    Eczema     There are no problems to display for this patient.   History reviewed. No pertinent surgical history.     Home Medications    Prior to Admission medications   Medication Sig Start Date End Date Taking? Authorizing Provider  amoxicillin (AMOXIL) 500 MG capsule Take 1 capsule (500 mg total) by mouth 3 (three) times daily. 02/01/17   [name], [name], NP  amoxicillin-clavulanate (AUGMENTIN) 875-125 MG tablet Take 1 tablet by mouth 2 (two) times daily. 07/05/17   [name], [name], [name]  benzonatate (TESSALON) 100 MG capsule Take 2 capsules (200 mg total) by mouth 2 (two) times daily as needed for cough. 03/06/16   [name], [name], [name]  cetirizine (ZYRTEC ALLERGY) 10 MG tablet Take 1 tablet (10 mg total) by mouth daily. 03/06/16   [name], [name], [name]  hydrOXYzine (ATARAX/VISTARIL) 25 MG tablet Take 25 mg by mouth daily.    [provider]  lisdexamfetamine (VYVANSE) 70 MG capsule Take 70 mg by mouth daily.    [provider]  meloxicam (MOBIC) 15 MG tablet Take 1 tablet (15 mg total) by mouth daily. 04/03/19   [name], [name], [name]  mometasone (NASONEX) 50 MCG/ACT nasal spray Place 2 sprays into the nose daily.    [provider]  montelukast (SINGULAIR) 10 MG tablet  Take 10 mg by mouth at bedtime.    [provider]  naproxen (NAPROSYN) 500 MG tablet Take 1 tablet (500 mg total) by mouth 2 (two) times daily. 02/01/17   [name], [name], NP  olopatadine (PATANOL) 0.1 % ophthalmic solution Place 1 drop into both eyes 2 (two) times daily.    [provider]    Family History History reviewed. No pertinent family history.  Social History Social History   Tobacco Use   Smoking status: Some Days    Types: Cigarettes   Smokeless tobacco: Never  Substance Use Topics   Alcohol use: Yes    Comment: occ   Drug use: Yes    Types: Marijuana     Allergies   Patient has no known allergies.   Review of Systems Review of Systems  Musculoskeletal:        R knee pain   All other systems reviewed and are negative.   Physical Exam Triage Vital Signs ED Triage Vitals  Enc Vitals Group     BP 05/28/21 1359 114/76     Pulse Rate 05/28/21 1359 78     Resp 05/28/21 1359 18     Temp 05/28/21 1359 98.1 F (36.7 C)     Temp Source 05/28/21 1359 Oral     SpO2 05/28/21 1359 97 %     Weight 05/28/21 1358 227 lb  1.2 oz (103 kg)     Height 05/28/21 1358 5\' 11"  (1.803 m)     Head Circumference --      Peak Flow --      Pain Score 05/28/21 1357 7     Pain Loc --      Pain Edu? --      Excl. in GC? --    No data found.  Updated Vital Signs BP 114/76 (BP Location: Left Arm)   Pulse 78   Temp 98.1 F (36.7 C) (Oral)   Resp 18   Ht 5\' 11"  (1.803 m)   Wt 227 lb 1.2 oz (103 kg)   SpO2 97%   BMI 31.67 kg/m   Visual Acuity Right Eye Distance:   Left Eye Distance:   Bilateral Distance:    Right Eye Near:   Left Eye Near:    Bilateral Near:     Physical Exam Vitals reviewed.  Constitutional:      General: He is not in acute distress.    Appearance: Normal appearance. He is not ill-appearing.  HENT:     Head: Normocephalic and atraumatic.  Pulmonary:     Effort: Pulmonary effort is normal.  Musculoskeletal:     Comments: R  knee - no skin changes or swelling. Minimally TTP over the lateral aspect. ROM flexion and extension intact and without crepitus or stiffness. No joint laxity. Negative valgus/varus stress test, mcmurray, anterior/posterior drawer. Gait intact but with limp. DP 2+.  Neurological:     General: No focal deficit present.     Mental Status: He is alert and oriented to person, place, and time.  Psychiatric:        Mood and Affect: Mood normal.        Behavior: Behavior normal.        Thought Content: Thought content normal.        Judgment: Judgment normal.     UC Treatments / Results  Labs (all labs ordered are listed, but only abnormal results are displayed) Labs Reviewed - No data to display  EKG   Radiology DG Knee Complete 4 Views Right  Result Date: 05/28/2021 CLINICAL DATA:  Right knee pain after injury yesterday. EXAM: RIGHT KNEE - COMPLETE 4+ VIEW COMPARISON:  None Available. FINDINGS: No evidence of fracture, dislocation, or joint effusion. No evidence of arthropathy or other focal bone abnormality. Soft tissues are unremarkable. IMPRESSION: Negative. Electronically Signed   By: M.D.   On: 05/28/2021 14:21    Procedures Procedures (including critical care time)  Medications Ordered in UC Medications - No data to display  Initial Impression / Assessment and Plan / UC Course  I have reviewed the triage vital signs and the nursing notes.  Pertinent labs & imaging results that were available during my care of the patient were reviewed by me and considered in my medical decision making (see chart for details).     This patient is a very pleasant 26 y.o. year old male presenting with R knee pain following basketball injury. Neurovascularly intact. Xray R knee - negative. Will manage as LCL sprain with brace, RICE. ED return precautions discussed. Patient verbalizes understanding and agreement.    Final Clinical Impressions(s) / UC Diagnoses   Final diagnoses:   Sprain of lateral collateral ligament of right knee, initial encounter     Discharge Instructions      -Knee brace while pain persists -Rice, ice, elevation -You can take Tylenol up to 1000  mg 3 times daily, and ibuprofen up to 600 mg 3 times daily with food.  You can take these together, or alternate every 3-4 hours.    ED Prescriptions   None    PDMP not reviewed this encounter.   Rhys Martini, [name] 05/28/21 1459

## 2021-05-28 NOTE — Discharge Instructions (Addendum)
-  Knee brace while pain persists -Rice, ice, elevation -You can take Tylenol up to 1000 mg 3 times daily, and ibuprofen up to 600 mg 3 times daily with food.  You can take these together, or alternate every 3-4 hours.

## 2021-07-04 ENCOUNTER — Encounter (HOSPITAL_COMMUNITY): Payer: Self-pay

## 2021-07-04 ENCOUNTER — Ambulatory Visit (HOSPITAL_COMMUNITY)
Admission: EM | Admit: 2021-07-04 | Discharge: 2021-07-04 | Disposition: A | Discharge status: Home / Self Care | Payer: Self-pay

## 2021-07-04 DIAGNOSIS — R42 Dizziness and giddiness: Secondary | ICD-10-CM

## 2021-07-04 NOTE — ED Provider Notes (Signed)
[facility]    CSN: [id number] Arrival date & time: 07/04/21  1439      History   Chief Complaint Chief Complaint  Patient presents with   Dizziness    HPI Aaron Lynn is a 26 y.o. male.   26 year old male presents with dizziness.  Patient relates that yesterday while he was at work after he had been working for about hour and a half he started getting dizzy, lightheaded to where he felt like he was going to pass out.  Patient indicates that he had to sit down and and drink fluids, patient indicates he did end up going home out of the work environment.  Patient relates while at home he began feeling better he was drinking fluids and resting.  Today he presents for evaluation but he relates that he is improved and not have any symptoms today.  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  Patient indicates that he works from 2:00 in the afternoon to 10:00 at night.  Patient relates that this is happened 1 other time several weeks ago to where he felt like he got overheated and got dizzy.  Patient indicates that sometimes he eats before he goes into work but not all the time, yesterday he did not eat before starting his shift.  Patient denies headache, no fever or chills, no nausea or vomiting, no weakness or problems with balance.  Patient denies any chest pain or palpitations.   Dizziness   Past Medical History:  Diagnosis Date   ADHD    Asthma    Eczema     There are no problems to display for this patient.   History reviewed. No pertinent surgical history.     Home Medications    Prior to Admission medications   Medication Sig Start Date End Date Taking? Authorizing Provider  cetirizine (ZYRTEC ALLERGY) 10 MG tablet Take 1 tablet (10 mg total) by mouth daily. 03/06/16  Yes Roxy Horseman, [name]  mometasone (NASONEX) 50 MCG/ACT nasal spray Place 2 sprays into the nose daily.   Yes  [provider]  naproxen (NAPROSYN) 500 MG tablet Take 1 tablet (500 mg total) by mouth 2 (two) times daily. 02/01/17  Yes Neese, [name], NP  amoxicillin (AMOXIL) 500 MG capsule Take 1 capsule (500 mg total) by mouth 3 (three) times daily. 02/01/17   Janne Napoleon, NP  amoxicillin-clavulanate (AUGMENTIN) 875-125 MG tablet Take 1 tablet by mouth 2 (two) times daily. 07/05/17   [name], Charlesetta Ivory, [name]  benzonatate (TESSALON) 100 MG capsule Take 2 capsules (200 mg total) by mouth 2 (two) times daily as needed for cough. 03/06/16   Roxy Horseman, [name]  hydrOXYzine (ATARAX/VISTARIL) 25 MG tablet Take 25 mg by mouth daily.    [provider]  lisdexamfetamine (VYVANSE) 70 MG capsule Take 70 mg by mouth daily.    [provider]  meloxicam (MOBIC) 15 MG tablet Take 1 tablet (15 mg total) by mouth daily. 04/03/19   [name], Delorise Royals, [name]  montelukast (SINGULAIR) 10 MG tablet Take 10 mg by mouth at bedtime.    [provider]  olopatadine (PATANOL) 0.1 % ophthalmic solution Place 1 drop into both eyes 2 (two) times daily.    [provider]    Family History History reviewed. No pertinent family history.  Social History Social History   Tobacco Use   Smoking status: Every Day  Packs/day: 0.50    Types: Cigarettes   Smokeless tobacco: Never  Vaping Use   Vaping Use: Never used  Substance Use Topics   Alcohol use: Yes    Comment: occ   Drug use: Yes    Frequency: 3.0 times per week    Types: Marijuana     Allergies   Patient has no known allergies.   Review of Systems Review of Systems  Neurological:  Positive for dizziness.     Physical Exam Triage Vital Signs ED Triage Vitals  Enc Vitals Group     BP 07/04/21 1507 128/67     Pulse Rate 07/04/21 1507 65     Resp 07/04/21 1507 18     Temp 07/04/21 1507 97.7 F (36.5 C)     Temp Source 07/04/21 1507 Oral     SpO2 07/04/21 1507 96 %     Weight --      Height --       Head Circumference --      Peak Flow --      Pain Score 07/04/21 1510 3     Pain Loc --      Pain Edu? --      Excl. in GC? --    Orthostatic VS for the past 24 hrs:  BP- Lying Pulse- Lying BP- Sitting Pulse- Sitting BP- Standing at 0 minutes Pulse- Standing at 0 minutes  07/04/21 1511 112/62 62 123/74 67 110/87 73    Updated Vital Signs BP 128/67 (BP Location: Left Arm)   Pulse 65   Temp 97.7 F (36.5 C) (Oral)   Resp 18   SpO2 96%   Visual Acuity Right Eye Distance:   Left Eye Distance:   Bilateral Distance:    Right Eye Near:   Left Eye Near:    Bilateral Near:     Physical Exam Constitutional:      Appearance: Normal appearance.  HENT:     Right Ear: Tympanic membrane and ear canal normal.     Left Ear: Tympanic membrane and ear canal normal.     Mouth/Throat:     Mouth: Mucous membranes are moist.     Pharynx: Oropharynx is clear.  Cardiovascular:     Rate and Rhythm: Normal rate and regular rhythm.     Heart sounds: Normal heart sounds.  Pulmonary:     Effort: Pulmonary effort is normal.     Breath sounds: Normal breath sounds and air entry. No wheezing, rhonchi or rales.  Abdominal:     General: Abdomen is flat. Bowel sounds are normal.     Palpations: Abdomen is soft.     Tenderness: There is no abdominal tenderness.  Lymphadenopathy:     Cervical: No cervical adenopathy.  Neurological:     General: No focal deficit present.     Mental Status: He is alert and oriented to person, place, and time.     Cranial Nerves: Cranial nerves 2-12 are intact.     Motor: Motor function is intact.     Coordination: Coordination is intact.      UC Treatments / Results  Labs (all labs ordered are listed, but only abnormal results are displayed) Labs Reviewed - No data to display  EKG   Radiology No results found.  Procedures Procedures (including critical care time)  Medications Ordered in UC Medications - No data to display  Initial Impression /  Assessment and Plan / UC Course  I have reviewed the triage vital signs  and the nursing notes.  Pertinent labs & imaging results that were available during my care of the patient were reviewed by me and considered in my medical decision making (see chart for details).    Plan: 1.  Patient advised to eat and drink fluids before beginning his shift, and make sure to drink fluids while working his shift throughout that work time. 2.  Patient advised to follow-up with PCP or return to urgent care if symptoms fail to improve. Final Clinical Impressions(s) / UC Diagnoses   Final diagnoses:  Dizziness     Discharge Instructions      Advised to eat lunch and drink fluids before going into work. Advised to drink fluids frequently while at work, working in the hot environment. Advised to follow-up PCP or return to urgent care if symptoms fail to improve.    ED Prescriptions   None    PDMP not reviewed this encounter.   Ellsworth Lennox, [name] 07/04/21 1537

## 2021-10-16 ENCOUNTER — Other Ambulatory Visit: Payer: Self-pay

## 2021-10-16 ENCOUNTER — Encounter (HOSPITAL_COMMUNITY): Payer: Self-pay | Admitting: *Deleted

## 2021-10-16 ENCOUNTER — Emergency Department (HOSPITAL_COMMUNITY)
Admission: EM | Admit: 2021-10-16 | Discharge: 2021-10-16 | Discharge status: Against Medical Advice | Payer: Self-pay | Attending: Emergency Medicine | Admitting: Emergency Medicine

## 2021-10-16 DIAGNOSIS — S61216A Laceration without foreign body of right little finger without damage to nail, initial encounter: Secondary | ICD-10-CM | POA: Insufficient documentation

## 2021-10-16 DIAGNOSIS — W25XXXA Contact with sharp glass, initial encounter: Secondary | ICD-10-CM | POA: Insufficient documentation

## 2021-10-16 DIAGNOSIS — Z5321 Procedure and treatment not carried out due to patient leaving prior to being seen by health care provider: Secondary | ICD-10-CM | POA: Insufficient documentation

## 2021-10-16 DIAGNOSIS — Y92009 Unspecified place in unspecified non-institutional (private) residence as the place of occurrence of the external cause: Secondary | ICD-10-CM | POA: Insufficient documentation

## 2021-10-16 NOTE — ED Notes (Signed)
Pt left due to not being seen quick enough 

## 2021-10-16 NOTE — ED Triage Notes (Signed)
The pt stuck his fist through a glass in a house  window  he was angry  h has a laceration to his rt little finger bleeding controlled with a bandage at present

## 2021-10-17 ENCOUNTER — Ambulatory Visit
Admission: EM | Admit: 2021-10-17 | Discharge: 2021-10-17 | Disposition: A | Discharge status: Home / Self Care | Payer: Self-pay | Attending: Emergency Medicine | Admitting: Emergency Medicine

## 2021-10-17 DIAGNOSIS — Z23 Encounter for immunization: Secondary | ICD-10-CM

## 2021-10-17 DIAGNOSIS — S61216A Laceration without foreign body of right little finger without damage to nail, initial encounter: Secondary | ICD-10-CM

## 2021-10-17 MED ORDER — SULFAMETHOXAZOLE-TRIMETHOPRIM 800-160 MG PO TABS: 1.0000 | ORAL_TABLET | Freq: Two times a day (BID) | ORAL | 0 refills | Status: AC

## 2021-10-17 MED ORDER — TETANUS-DIPHTH-ACELL PERTUSSIS 5-2.5-18.5 LF-MCG/0.5 IM SUSY
0.5000 mL | PREFILLED_SYRINGE | Freq: Once | INTRAMUSCULAR | Status: AC
  Administered 2021-10-17: 0.5 mL via INTRAMUSCULAR

## 2021-10-17 NOTE — ED Triage Notes (Signed)
The patient states last night he hit his hand on something (right hand) and now has swelling, redness and a laceration the the pinky finger.   Home interventions: advil PM (last taken, last night)

## 2021-10-17 NOTE — Discharge Instructions (Addendum)
Please see the enclosed instructions regarding care of your laceration.  Please return in 10 days to have your 5 stitches removed.    Please pick up your prescription for bactrim and begin taking them today, one tablet twice daily.

## 2021-10-17 NOTE — ED Provider Notes (Signed)
[facility]    CSN: [id number] Arrival date & time: 10/17/21  1123    HISTORY   Chief Complaint  Patient presents with   Laceration   Hand Injury   HPI Aaron Lynn is a pleasant, 26 y.o. male who presents to urgent care today. Patient presents to urgent care after cutting his hand on a piece of broken glass, states he broke his window in his home.  Patient states this occurred about an hour ago.  Patient states he washed it bandaged the tip of his right little finger where he was cut and then came here.  Patient states he had a tetanus shot 9 months ago.  The history is provided by the patient.  Laceration Location:  Finger Finger laceration location:  R ring finger Length:  2 cm Depth:  Cutaneous Quality: jagged   Bleeding: controlled   Laceration mechanism:  Broken glass Pain details:    Quality:  Aching, burning and dull Hand Injury  Past Medical History:  Diagnosis Date   ADHD    Asthma    Eczema    There are no problems to display for this patient.  History reviewed. No pertinent surgical history.  Home Medications    Prior to Admission medications   Medication Sig Start Date End Date Taking? Authorizing Provider  amoxicillin (AMOXIL) 500 MG capsule Take 1 capsule (500 mg total) by mouth 3 (three) times daily. 02/01/17   Janne Napoleon, NP  amoxicillin-clavulanate (AUGMENTIN) 875-125 MG tablet Take 1 tablet by mouth 2 (two) times daily. 07/05/17   [name], Charlesetta Ivory, [name]  benzonatate (TESSALON) 100 MG capsule Take 2 capsules (200 mg total) by mouth 2 (two) times daily as needed for cough. 03/06/16   Roxy Horseman, [name]  cetirizine (ZYRTEC ALLERGY) 10 MG tablet Take 1 tablet (10 mg total) by mouth daily. 03/06/16   Roxy Horseman, [name]  hydrOXYzine (ATARAX/VISTARIL) 25 MG tablet Take 25 mg by mouth daily.    [provider]  lisdexamfetamine (VYVANSE) 70 MG capsule Take 70 mg by mouth daily.    [provider]  meloxicam  (MOBIC) 15 MG tablet Take 1 tablet (15 mg total) by mouth daily. 04/03/19   [name], Delorise Royals, [name]  mometasone (NASONEX) 50 MCG/ACT nasal spray Place 2 sprays into the nose daily.    [provider]  montelukast (SINGULAIR) 10 MG tablet Take 10 mg by mouth at bedtime.    [provider]  naproxen (NAPROSYN) 500 MG tablet Take 1 tablet (500 mg total) by mouth 2 (two) times daily. 02/01/17   Janne Napoleon, NP  olopatadine (PATANOL) 0.1 % ophthalmic solution Place 1 drop into both eyes 2 (two) times daily.    [provider]    Family History History reviewed. No pertinent family history. Social History Social History   Tobacco Use   Smoking status: Every Day    Packs/day: 0.50    Types: Cigarettes   Smokeless tobacco: Never  Vaping Use   Vaping Use: Never used  Substance Use Topics   Alcohol use: Yes    Comment: occ   Drug use: Yes    Frequency: 3.0 times per week    Types: Marijuana   Allergies   Patient has no known allergies.  Review of Systems Review of Systems Pertinent findings revealed after performing a 14 point review of systems has been noted in the history of present illness.  Physical Exam Triage Vital Signs ED Triage Vitals  Enc  Vitals Group     BP 11/06/20 0827 (!) 147/82     Pulse Rate 11/06/20 0827 72     Resp 11/06/20 0827 18     Temp 11/06/20 0827 98.3 F (36.8 C)     Temp Source 11/06/20 0827 Oral     SpO2 11/06/20 0827 98 %     Weight --      Height --      Head Circumference --      Peak Flow --      Pain Score 11/06/20 0826 5     Pain Loc --      Pain Edu? --      Excl. in [place]? --   No data found.  Updated Vital Signs BP 121/84 (BP Location: Right Arm)   Pulse 72   Temp 98 F (36.7 C) (Oral)   Resp 16   SpO2 98%   Physical Exam Vitals and nursing note reviewed.  Constitutional:      General: He is not in acute distress.    Appearance: Normal appearance. He is normal weight. He is not ill-appearing.   HENT:     Head: Normocephalic and atraumatic.  Eyes:     Extraocular Movements: Extraocular movements intact.     Conjunctiva/sclera: Conjunctivae normal.     Pupils: Pupils are equal, round, and reactive to light.  Cardiovascular:     Rate and Rhythm: Normal rate and regular rhythm.  Pulmonary:     Effort: Pulmonary effort is normal.     Breath sounds: Normal breath sounds.  Musculoskeletal:        General: Normal range of motion.     Cervical back: Normal range of motion and neck supple.  Skin:    General: Skin is warm and dry.     Findings: Lesion (Laceration at tip of right middle finger without nail) present.  Neurological:     General: No focal deficit present.     Mental Status: He is alert and oriented to person, place, and time. Mental status is at baseline.  Psychiatric:        Mood and Affect: Mood normal.        Behavior: Behavior normal.        Thought Content: Thought content normal.        Judgment: Judgment normal.     Visual Acuity Right Eye Distance:   Left Eye Distance:   Bilateral Distance:    Right Eye Near:   Left Eye Near:    Bilateral Near:     UC Couse / Diagnostics / Procedures:     Radiology No results found.  Procedures Laceration Repair  Date/Time: 10/17/2021 2:27 PM  Performed by: Lynden Oxford Scales, [name] Authorized by: Lynden Oxford Scales, [name]   Consent:    Consent obtained:  Verbal   Consent given by:  Patient   Risks, benefits, and alternatives were discussed: yes     Risks discussed:  Infection, need for additional repair, nerve damage, poor wound healing, poor cosmetic result and pain   Alternatives discussed:  No treatment, delayed treatment, observation and referral Universal protocol:    Procedure explained and questions answered to patient or proxy's satisfaction: yes     Patient identity confirmed:  Verbally with patient and arm band Anesthesia:    Anesthesia method:  None Laceration details:    Location:   Finger   Finger location:  R small finger   Length (cm):  2   Depth (mm):  3 Pre-procedure details:    Preparation:  Patient was prepped and draped in usual sterile fashion Exploration:    Limited defect created (wound extended): no     Hemostasis achieved with:  Epinephrine   Imaging outcome: foreign body not noted     Wound exploration: wound explored through full range of motion     Wound extent: no fascia violation noted, no foreign bodies/material noted, no muscle damage noted, no nerve damage noted, no tendon damage noted, no underlying fracture noted and no vascular damage noted     Contaminated: yes   Treatment:    Area cleansed with:  Povidone-iodine   Amount of cleaning:  Standard   Debridement:  None   Undermining:  None   Scar revision: no   Skin repair:    Repair method:  Sutures   Suture size:  4-0   Suture material:  Prolene   Suture technique:  Simple interrupted   Number of sutures:  5 Approximation:    Approximation:  Close Repair type:    Repair type:  Simple Post-procedure details:    Dressing:  Tube gauze and non-adherent dressing   Procedure completion:  Tolerated  (including critical care time) EKG  Pending results:  Labs Reviewed - No data to display  Medications Ordered in UC: Medications  Tdap (BOOSTRIX) injection 0.5 mL (0.5 mLs Intramuscular Given 10/17/21 1448)    UC Diagnoses / Final Clinical Impressions(s)   I have reviewed the triage vital signs and the nursing notes.  Pertinent labs & imaging results that were available during my care of the patient were reviewed by me and considered in my medical decision making (see chart for details).    Final diagnoses:  Laceration of right little finger without foreign body without damage to nail, initial encounter   Patient provided with education regarding self-care at home of laceration repair.  Patient provided with a 5-day course of Bactrim for infection prophylaxis.  Patient provided with a  splint to protect the tip of his finger from further injury.  Patient advised to return in 10 days to have sutures removed.  Patient advised to monitor for signs of infection of wound and report to the emergency department if these occur.  ED Prescriptions     Medication Sig Dispense Auth. Provider   sulfamethoxazole-trimethoprim (BACTRIM DS) 800-160 MG tablet Take 1 tablet by mouth 2 (two) times daily for 5 days. 10 tablet Theadora Rama Scales, [name]      PDMP not reviewed this encounter.  Pending results:  Labs Reviewed - No data to display  Discharge Instructions:   Discharge Instructions      Please see the enclosed instructions regarding care of your laceration.  Please return in 10 days to have your 5 stitches removed.    Please pick up your prescription for bactrim and begin taking them today, one tablet twice daily.         Disposition Upon Discharge:  Condition: stable for discharge home  Patient presented with an acute illness with associated systemic symptoms and significant discomfort requiring urgent management. In my opinion, this is a condition that a prudent lay person (someone who possesses an average knowledge of health and medicine) may potentially expect to result in complications if not addressed urgently such as respiratory distress, impairment of bodily function or dysfunction of bodily organs.   Routine symptom specific, illness specific and/or disease specific instructions were discussed with the patient and/or caregiver at length.   As such, the  patient has been evaluated and assessed, work-up was performed and treatment was provided in alignment with urgent care protocols and evidence based medicine.  Patient/parent/caregiver has been advised that the patient may require follow up for further testing and treatment if the symptoms continue in spite of treatment, as clinically indicated and appropriate.  Patient/parent/caregiver has been advised to  return to the [facility] or PCP if no better; to PCP or the Emergency Department if new signs and symptoms develop, or if the current signs or symptoms continue to change or worsen for further workup, evaluation and treatment as clinically indicated and appropriate  The patient will follow up with their current PCP if and as advised. If the patient does not currently have a PCP we will assist them in obtaining one.   The patient may need specialty follow up if the symptoms continue, in spite of conservative treatment and management, for further workup, evaluation, consultation and treatment as clinically indicated and appropriate.   Patient/parent/caregiver verbalized understanding and agreement of plan as discussed.  All questions were addressed during visit.  Please see discharge instructions below for further details of plan.  This office note has been dictated using Teaching laboratory technician.  Unfortunately, this method of dictation can sometimes lead to typographical or grammatical errors.  I apologize for your inconvenience in advance if this occurs.  Please do not hesitate to reach out to me if clarification is needed.      Theadora Rama Scales, [name] 10/18/21 [phone]

## 2021-10-27 ENCOUNTER — Ambulatory Visit
Admission: EM | Admit: 2021-10-27 | Discharge: 2021-10-27 | Disposition: A | Discharge status: Home / Self Care | Payer: Self-pay

## 2021-10-27 DIAGNOSIS — Z4802 Encounter for removal of sutures: Secondary | ICD-10-CM

## 2021-10-27 DIAGNOSIS — S61216D Laceration without foreign body of right little finger without damage to nail, subsequent encounter: Secondary | ICD-10-CM

## 2021-10-27 NOTE — ED Triage Notes (Signed)
Pt presents for suture removal. Pt denies pain and discomfort.

## 2022-08-28 ENCOUNTER — Encounter (HOSPITAL_COMMUNITY): Payer: Self-pay

## 2022-08-28 ENCOUNTER — Telehealth (HOSPITAL_COMMUNITY): Payer: Self-pay

## 2022-08-28 ENCOUNTER — Ambulatory Visit (HOSPITAL_COMMUNITY)
Admission: EM | Admit: 2022-08-28 | Discharge: 2022-08-28 | Disposition: A | Discharge status: Home / Self Care | Payer: Self-pay | Attending: Family Medicine | Admitting: Family Medicine

## 2022-08-28 DIAGNOSIS — K047 Periapical abscess without sinus: Secondary | ICD-10-CM

## 2022-08-28 DIAGNOSIS — K0889 Other specified disorders of teeth and supporting structures: Secondary | ICD-10-CM

## 2022-08-28 MED ORDER — KETOROLAC TROMETHAMINE 30 MG/ML IJ SOLN
INTRAMUSCULAR | Status: AC
  Filled 2022-08-28: qty 1

## 2022-08-28 MED ORDER — IBUPROFEN 800 MG PO TABS: 800.0000 mg | ORAL_TABLET | Freq: Three times a day (TID) | ORAL | 0 refills | Status: DC

## 2022-08-28 MED ORDER — AMOXICILLIN-POT CLAVULANATE 875-125 MG PO TABS: 1.0000 | ORAL_TABLET | Freq: Two times a day (BID) | ORAL | 0 refills | Status: DC

## 2022-08-28 MED ORDER — KETOROLAC TROMETHAMINE 30 MG/ML IJ SOLN
30.0000 mg | Freq: Once | INTRAMUSCULAR | Status: AC
  Administered 2022-08-28: 30 mg via INTRAMUSCULAR

## 2022-08-28 NOTE — Discharge Instructions (Addendum)
You have a dental infection, take the antibiotics twice daily with food until finished.  We have given you a Toradol injection today in clinic, do not take any more ibuprofen, Advil or other NSAIDs until tonight prior to bed.  You can take 500 mg of Tylenol as needed for breakthrough pain throughout the rest of the day.  Starting tomorrow you can do 800 mg of ibuprofen 3 times daily with food.   I believe your infection is due to a dental issue, which will ultimately need to be handled by a dentist.  Below are some dental resources.  Please return to clinic if you develop worsening of swelling over the next 72 hours, fever, or any new concerning symptoms.  [facility] [place] Address: [address] [place], [place], [place] [address] Phone: [phone]  [facility] [phone] extension [phone] [address]  Dr. Lawrence Marseilles [phone] [address]  [place] [phone] [address] [facility] [place].  [facility] [phone] extension [phone], [place], [place], [address] First come first serve for the first 10 clients.  May do simple extractions only, no wisdom teeth or surgery.  You may try the second for Thursday of the month starting at 6:30 AM.  [facility] You may call the school to see if they are still helping to provide dental care for emergent cases.

## 2022-08-28 NOTE — ED Provider Notes (Signed)
[facility]    CSN: [id number] Arrival date & time: 08/28/22  1006      History   Chief Complaint Chief Complaint  Patient presents with   Dental Pain    HPI Aaron Lynn is a 27 y.o. male.   Patient presents to clinic with his mother for complaints of right-sided facial swelling and dental pain.  He has had dental pain for 'a while.'  Mother noticed swelling 2 days ago, patient noticed right-sided facial swelling yesterday.  He does not currently have a dentist.  He denies any fevers, reports he was sweating last night in bed.  He is able to eat and drink.  Denies any visible abscess or purulent drainage.    The history is provided by the patient and medical records.  Dental Pain Associated symptoms: facial swelling   Associated symptoms: no fever     Past Medical History:  Diagnosis Date   ADHD    Asthma    Eczema     There are no problems to display for this patient.   History reviewed. No pertinent surgical history.     Home Medications    Prior to Admission medications   Medication Sig Start Date End Date Taking? Authorizing Provider  amoxicillin-clavulanate (AUGMENTIN) 875-125 MG tablet Take 1 tablet by mouth every 12 (twelve) hours. 08/28/22  Yes Rinaldo Ratel, [place], FNP  ibuprofen (ADVIL) 800 MG tablet Take 1 tablet (800 mg total) by mouth 3 (three) times daily. 08/28/22  Yes Rinaldo Ratel, [place], FNP  cetirizine (ZYRTEC ALLERGY) 10 MG tablet Take 1 tablet (10 mg total) by mouth daily. 03/06/16   Roxy Horseman, [name]    Family History History reviewed. No pertinent family history.  Social History Social History   Tobacco Use   Smoking status: Every Day    Current packs/day: 0.50    Types: Cigarettes   Smokeless tobacco: Never  Vaping Use   Vaping status: Never Used  Substance Use Topics   Alcohol use: Yes    Comment: occ   Drug use: Yes    Frequency: 3.0 times per week    Types: Marijuana     Allergies   Patient has no  known allergies.   Review of Systems Review of Systems  Constitutional:  Positive for chills. Negative for fever.  HENT:  Positive for dental problem and facial swelling. Negative for trouble swallowing.   Respiratory:  Negative for shortness of breath.      Physical Exam Triage Vital Signs ED Triage Vitals [08/28/22 1102]  Encounter Vitals Group     BP 137/84     Systolic BP Percentile      Diastolic BP Percentile      Pulse Rate 98     Resp 16     Temp 97.9 F (36.6 C)     Temp Source Oral     SpO2 98 %     Weight      Height      Head Circumference      Peak Flow      Pain Score      Pain Loc      Pain Education      Exclude from Growth Chart    No data found.  Updated Vital Signs BP 137/84 (BP Location: Left Arm)   Pulse 98   Temp 97.9 F (36.6 C) (Oral)   Resp 16   SpO2 98%   Visual Acuity Right Eye Distance:   Left Eye Distance:  Bilateral Distance:    Right Eye Near:   Left Eye Near:    Bilateral Near:     Physical Exam Vitals and nursing note reviewed.  Constitutional:      Appearance: Normal appearance.  HENT:     Head: Normocephalic and atraumatic.     Right Ear: External ear normal.     Left Ear: External ear normal.     Nose: Nose normal.     Mouth/Throat:     Mouth: Mucous membranes are moist.     Dentition: Abnormal dentition. Dental tenderness present.      Comments: Tenderness surrounding right upper molars. No obvious abscess or purulent drainage.  Eyes:     Conjunctiva/sclera: Conjunctivae normal.  Cardiovascular:     Rate and Rhythm: Normal rate.  Pulmonary:     Effort: Pulmonary effort is normal. No respiratory distress.  Musculoskeletal:     Cervical back: Normal range of motion.  Lymphadenopathy:     Cervical: Cervical adenopathy present.  Neurological:     General: No focal deficit present.     Mental Status: He is alert and oriented to person, place, and time.  Psychiatric:        Mood and Affect: Mood normal.         Behavior: Behavior normal. Behavior is cooperative.      UC Treatments / Results  Labs (all labs ordered are listed, but only abnormal results are displayed) Labs Reviewed - No data to display  EKG   Radiology No results found.  Procedures Procedures (including critical care time)  Medications Ordered in UC Medications  ketorolac (TORADOL) 30 MG/ML injection 30 mg (has no administration in time range)    Initial Impression / Assessment and Plan / UC Course  I have reviewed the triage vital signs and the nursing notes.  Pertinent labs & imaging results that were available during my care of the patient were reviewed by me and considered in my medical decision making (see chart for details).  Vitals and triage reviewed, patient is hemodynamically stable.  Right upper molar and gingival tenderness with associated right-sided cheek and facial swelling.  No obvious abscess or drainage on exam.  Able to speak freely, no airway involvement. Uvula midline. Will cover for dental infection with Augmentin and provided with dental resources.  IM Toradol given in clinic for pain control, NSAIDs at home.  Plan of care, follow-up care return precautions given, no questions at this time.      Final Clinical Impressions(s) / UC Diagnoses   Final diagnoses:  Dental infection  Pain, dental     Discharge Instructions      You have a dental infection, take the antibiotics twice daily with food until finished.  We have given you a Toradol injection today in clinic, do not take any more ibuprofen, Advil or other NSAIDs until tonight prior to bed.  You can take 500 mg of Tylenol as needed for breakthrough pain throughout the rest of the day.  Starting tomorrow you can do 800 mg of ibuprofen 3 times daily with food.   I believe your infection is due to a dental issue, which will ultimately need to be handled by a dentist.  Below are some dental resources.  Please return to clinic if you  develop worsening of swelling over the next 72 hours, fever, or any new concerning symptoms.  [facility] [place] Address: [address] [place], [place], [place] [address] Phone: [phone])  [phone]  [facility] [phone] extension [phone]  Dr. Lawrence Marseilles [phone] [address]  [place] [phone] [address] [facility] [place].  [facility] [phone] extension [phone] [place], [place], [address] First come first serve for the first 10 clients.  May do simple extractions only, no wisdom teeth or surgery.  You may try the second for Thursday of the month starting at 6:30 AM.  [facility] You may call the school to see if they are still helping to provide dental care for emergent cases.      ED Prescriptions     Medication Sig Dispense Auth. Provider   amoxicillin-clavulanate (AUGMENTIN) 875-125 MG tablet Take 1 tablet by mouth every 12 (twelve) hours. 14 tablet Rinaldo Ratel, [place], [place]   ibuprofen (ADVIL) 800 MG tablet Take 1 tablet (800 mg total) by mouth 3 (three) times daily. 21 tablet Jaquay Morneault, [place], [place]      PDMP not reviewed this encounter.   Marnita Poirier, [place] N, [place] 08/28/22 1123

## 2022-08-28 NOTE — ED Triage Notes (Signed)
Pt presents for tooth pain and facial swelling.

## 2022-09-17 ENCOUNTER — Encounter (HOSPITAL_COMMUNITY): Payer: Self-pay

## 2022-09-17 ENCOUNTER — Ambulatory Visit (HOSPITAL_COMMUNITY)
Admission: EM | Admit: 2022-09-17 | Discharge: 2022-09-17 | Disposition: A | Discharge status: Home / Self Care | Payer: Self-pay | Attending: Physician Assistant | Admitting: Physician Assistant

## 2022-09-17 DIAGNOSIS — M545 Low back pain, unspecified: Secondary | ICD-10-CM

## 2022-09-17 DIAGNOSIS — S39012A Strain of muscle, fascia and tendon of lower back, initial encounter: Secondary | ICD-10-CM

## 2022-09-17 MED ORDER — LIDOCAINE 5 % EX PTCH: 1.0000 | MEDICATED_PATCH | CUTANEOUS | 0 refills | Status: DC

## 2022-09-17 MED ORDER — IBUPROFEN 800 MG PO TABS: 800.0000 mg | ORAL_TABLET | Freq: Three times a day (TID) | ORAL | 0 refills | Status: AC | PRN

## 2022-09-17 MED ORDER — METHOCARBAMOL 500 MG PO TABS: 500.0000 mg | ORAL_TABLET | Freq: Two times a day (BID) | ORAL | 0 refills | Status: DC

## 2022-09-17 NOTE — ED Provider Notes (Signed)
[facility]    CSN: [id number] Arrival date & time: 09/17/22  1035      History   Chief Complaint Chief Complaint  Patient presents with   Back Pain    HPI Aaron Lynn is a 27 y.o. male.   Patient presents today with an acute on chronic back pain.  Reports that for the past several years he has had intermittent back pain and about 2 days ago he rolled over in bed because his baby was crying and he felt a sudden severe pain in his lower back that has been intermittent since that time.  Pain is rated 7/8 on a 0-10 pain scale, localized to lower back without radiation, described as aching with periodic sharp pains, worse with certain movements, no alleviating factors identified.  He has tried Biofreeze and ibuprofen without improvement of symptoms.  He denies any bowel/bladder incontinence, lower extremity weakness, saddle anesthesia.  Denies history of malignancy.  Denies previous injury or surgery involving his back.  He has missed work as a result of symptoms.    Past Medical History:  Diagnosis Date   ADHD    Asthma    Eczema     There are no problems to display for this patient.   History reviewed. No pertinent surgical history.     Home Medications    Prior to Admission medications   Medication Sig Start Date End Date Taking? Authorizing Provider  lidocaine (LIDODERM) 5 % Place 1 patch onto the skin daily. Remove & Discard patch within 12 hours or as directed by MD 09/17/22  Yes Matea Stanard, Noberto Retort, [name]  methocarbamol (ROBAXIN) 500 MG tablet Take 1 tablet (500 mg total) by mouth 2 (two) times daily. 09/17/22  Yes Ward Boissonneault K, [name]  ibuprofen (ADVIL) 800 MG tablet Take 1 tablet (800 mg total) by mouth every 8 (eight) hours as needed. 09/17/22   Maleny Candy, Noberto Retort, [name]    Family History History reviewed. No pertinent family history.  Social History Social History   Tobacco Use   Smoking status: Every Day    Current packs/day: 0.50    Types: Cigarettes    Smokeless tobacco: Never  Vaping Use   Vaping status: Never Used  Substance Use Topics   Alcohol use: Yes    Comment: occ   Drug use: Yes    Frequency: 3.0 times per week    Types: Marijuana     Allergies   Patient has no known allergies.   Review of Systems Review of Systems  Constitutional:  Positive for activity change. Negative for appetite change, fatigue and fever.  Gastrointestinal:  Negative for abdominal pain, diarrhea, nausea and vomiting.  Genitourinary:  Negative for difficulty urinating and enuresis.  Musculoskeletal:  Positive for back pain. Negative for arthralgias and myalgias.  Neurological:  Negative for weakness and numbness.     Physical Exam Triage Vital Signs ED Triage Vitals  Encounter Vitals Group     BP 09/17/22 1209 118/68     Systolic BP Percentile --      Diastolic BP Percentile --      Pulse Rate 09/17/22 1209 60     Resp 09/17/22 1209 20     Temp 09/17/22 1209 98 F (36.7 C)     Temp Source 09/17/22 1209 Oral     SpO2 09/17/22 1209 98 %     Weight 09/17/22 1210 215 lb (97.5 kg)     Height 09/17/22 1210 5\' 11"  (1.803 m)  Head Circumference --      Peak Flow --      Pain Score 09/17/22 1210 6     Pain Loc --      Pain Education --      Exclude from Growth Chart --    No data found.  Updated Vital Signs BP 118/68 (BP Location: Left Arm)   Pulse 60   Temp 98 F (36.7 C) (Oral)   Resp 20   Ht 5\' 11"  (1.803 m)   Wt 215 lb (97.5 kg)   SpO2 98%   BMI 29.99 kg/m   Visual Acuity Right Eye Distance:   Left Eye Distance:   Bilateral Distance:    Right Eye Near:   Left Eye Near:    Bilateral Near:     Physical Exam Vitals reviewed.  Constitutional:      General: He is awake.     Appearance: Normal appearance. He is well-developed. He is not ill-appearing.     Comments: Very pleasant male appears stated age in no acute distress sitting comfortably in exam room  HENT:     Head: Normocephalic and atraumatic.      Mouth/Throat:     Pharynx: No oropharyngeal exudate, posterior oropharyngeal erythema or uvula swelling.  Cardiovascular:     Rate and Rhythm: Normal rate and regular rhythm.     Heart sounds: Normal heart sounds, S1 normal and S2 normal. No murmur heard. Pulmonary:     Effort: Pulmonary effort is normal.     Breath sounds: Normal breath sounds. No stridor. No wheezing, rhonchi or rales.     Comments: Clear to auscultation bilaterally Abdominal:     General: Bowel sounds are normal.     Palpations: Abdomen is soft.     Tenderness: There is no abdominal tenderness. There is no right CVA tenderness, left CVA tenderness, guarding or rebound.     Comments: Benign abdominal exam  Musculoskeletal:     Cervical back: No tenderness or bony tenderness.     Thoracic back: No tenderness or bony tenderness.     Lumbar back: Tenderness present. No spasms or bony tenderness. Negative right straight leg raise test and negative left straight leg raise test.     Comments: Back: Tenderness to palpation of bilateral lumbar paraspinal muscles.  No deformity or step-off noted.  No pain percussion of vertebrae.  Strength 5/5 bilateral lower extremities.  Negative straight leg raise bilaterally.  Negative Faber bilaterally.  Neurological:     Mental Status: He is alert.  Psychiatric:        Behavior: Behavior is cooperative.      UC Treatments / Results  Labs (all labs ordered are listed, but only abnormal results are displayed) Labs Reviewed - No data to display  EKG   Radiology No results found.  Procedures Procedures (including critical care time)  Medications Ordered in UC Medications - No data to display  Initial Impression / Assessment and Plan / UC Course  I have reviewed the triage vital signs and the nursing notes.  Pertinent labs & imaging results that were available during my care of the patient were reviewed by me and considered in my medical decision making (see chart for  details).     Patient is well-appearing, afebrile, nontoxic, nontachycardic.  Patient denies any alarm symptoms that warrant emergent evaluation or imaging.  Plain films were deferred as he denies any recent trauma and has no focal bony tenderness.  Suspect muscular etiology.  He was  started on ibuprofen 3x daily with instruction to take additional NSAIDs with this medication due to risk of GI bleeding.  Can use acetaminophen/Tylenol for breakthrough pain.  Was prescribed Robaxin up to 2 times a day.  Discussed this can be sedating and he is not to drive or drink alcohol while taking it.  He was also given lidocaine patches for symptom relief and we discussed that these should be placed for 12 hours during the day and then remove for 12 hours at night; use only 1 patch per 24 hours.  Recommended conservative treatment measures including heat, rest, stretch.  He is to follow-up with sports medicine if his symptoms or not improving quickly and was given contact information for local provider with instruction to call to schedule an appointment.  Discussed that if he has any worsening symptoms including severe pain, bowel/bladder incontinence, lower extremity weakness, saddle anesthesia he needs to be seen immediately.  Strict return precautions given.  Work excuse note provided.   Final Clinical Impressions(s) / UC Diagnoses   Final diagnoses:  Acute bilateral low back pain without sciatica  Strain of lumbar region, initial encounter     Discharge Instructions      I believe that you have injured the muscles in your back.  Please start ibuprofen up to 3 times a day.  Do not take other NSAIDs with this medication including aspirin, ibuprofen/Advil, naproxen/Aleve.  You can use acetaminophen/Tylenol for breakthrough pain.  Take Robaxin up to 2 times a day.  This will make you sleepy so do not drive or drink alcohol taking it.  Apply lidocaine patch for 12 hours during the day; remove this for 12 hours  at night.  Use only 1 patch per 24 hours.  I recommend you follow-up with sports medicine; call to schedule an appointment.  If anything worsens and you have severe pain, going to the bathroom on yourself without noticing it, numbness or tingling in your legs, weakness in your legs you need to be seen immediately.      ED Prescriptions     Medication Sig Dispense Auth. Provider   ibuprofen (ADVIL) 800 MG tablet Take 1 tablet (800 mg total) by mouth every 8 (eight) hours as needed. 21 tablet [place] Wanner K, [name]   methocarbamol (ROBAXIN) 500 MG tablet Take 1 tablet (500 mg total) by mouth 2 (two) times daily. 20 tablet Reese Senk K, [name]   lidocaine (LIDODERM) 5 % Place 1 patch onto the skin daily. Remove & Discard patch within 12 hours or as directed by MD 30 patch Yareli Carthen K, [name]      PDMP not reviewed this encounter.   Jeani Hawking, [name] 09/17/22 1256

## 2022-09-17 NOTE — Discharge Instructions (Signed)
I believe that you have injured the muscles in your back.  Please start ibuprofen up to 3 times a day.  Do not take other NSAIDs with this medication including aspirin, ibuprofen/Advil, naproxen/Aleve.  You can use acetaminophen/Tylenol for breakthrough pain.  Take Robaxin up to 2 times a day.  This will make you sleepy so do not drive or drink alcohol taking it.  Apply lidocaine patch for 12 hours during the day; remove this for 12 hours at night.  Use only 1 patch per 24 hours.  I recommend you follow-up with sports medicine; call to schedule an appointment.  If anything worsens and you have severe pain, going to the bathroom on yourself without noticing it, numbness or tingling in your legs, weakness in your legs you need to be seen immediately.

## 2022-09-17 NOTE — ED Triage Notes (Signed)
Patient presents with low back pain x day 2. Patient treated with Biofreeze, muscle relaxer and Ibuprofen with minimal relief.

## 2022-11-18 ENCOUNTER — Ambulatory Visit: Payer: Self-pay

## 2022-11-18 DIAGNOSIS — Z23 Encounter for immunization: Secondary | ICD-10-CM

## 2023-06-11 ENCOUNTER — Other Ambulatory Visit: Payer: Self-pay

## 2023-06-11 ENCOUNTER — Ambulatory Visit (HOSPITAL_COMMUNITY)
Admission: EM | Admit: 2023-06-11 | Discharge: 2023-06-11 | Disposition: A | Discharge status: Home / Self Care | Attending: Internal Medicine | Admitting: Internal Medicine

## 2023-06-11 ENCOUNTER — Encounter (HOSPITAL_COMMUNITY): Payer: Self-pay | Admitting: *Deleted

## 2023-06-11 DIAGNOSIS — J069 Acute upper respiratory infection, unspecified: Secondary | ICD-10-CM

## 2023-06-11 DIAGNOSIS — H6123 Impacted cerumen, bilateral: Secondary | ICD-10-CM

## 2023-06-11 NOTE — Discharge Instructions (Addendum)
 Symptoms are most consistent with a viral infection.  This does not require antibiotic treatment.  We focus treatment on improving the symptoms.  Continue to use over-the-counter medications such as Tylenol , over-the-counter cough medication or cold and flu medication.  As the symptoms are improving, no further intervention needed.  On physical exam we did find that both ears were impacted and irrigation was done today.  Can try using over-the-counter Debrox to help reduce earwax buildup. Return to urgent care or PCP if symptoms worsen or fail to resolve.

## 2023-06-11 NOTE — ED Triage Notes (Signed)
 PT reports he has a head cold and needs a work note

## 2023-06-11 NOTE — ED Provider Notes (Signed)
 [facility]    CSN: [id number] Arrival date & time: 06/11/23  1504      History   Chief Complaint Chief Complaint  Patient presents with   work note    HPI Aaron Lynn is a 28 y.o. male.   28 year old male who presents urgent care with complaints of cough, congestion, sinus drainage.  He reports that his symptoms started last week but they are improving now.  He did not have any fevers, shortness of breath, chest pain, nausea, vomiting, diarrhea.  He was taking over-the-counter medication and his symptoms have gotten much better but he missed several days of work and is needing a note.  Of note on physical exam he was found to have impacted ear canals bilateral, patient relates that this is a common problem for him.  Offered ear irrigation and he has excepted.     Past Medical History:  Diagnosis Date   ADHD    Asthma    Eczema     There are no active problems to display for this patient.   History reviewed. No pertinent surgical history.     Home Medications    Prior to Admission medications   Medication Sig Start Date End Date Taking? Authorizing Provider  ibuprofen  (ADVIL ) 800 MG tablet Take 1 tablet (800 mg total) by mouth every 8 (eight) hours as needed. 09/17/22   Raspet, Erin K, [name]  lidocaine  (LIDODERM ) 5 % Place 1 patch onto the skin daily. Remove & Discard patch within 12 hours or as directed by MD 09/17/22   Raspet, [place] Dales K, [name]  methocarbamol  (ROBAXIN ) 500 MG tablet Take 1 tablet (500 mg total) by mouth 2 (two) times daily. 09/17/22   Raspet, Betsey Brow, [name]    Family History History reviewed. No pertinent family history.  Social History Social History   Tobacco Use   Smoking status: Every Day    Current packs/day: 0.50    Types: Cigarettes   Smokeless tobacco: Never  Vaping Use   Vaping status: Never Used  Substance Use Topics   Alcohol use: Yes    Comment: occ   Drug use: Yes    Frequency: 3.0 times per week    Types: Marijuana      Allergies   Patient has no known allergies.   Review of Systems Review of Systems  Constitutional:  Negative for chills and fever.  HENT:  Positive for congestion, rhinorrhea and sinus pressure. Negative for ear pain and sore throat.   Eyes:  Negative for pain and visual disturbance.  Respiratory:  Positive for cough. Negative for shortness of breath.   Cardiovascular:  Negative for chest pain and palpitations.  Gastrointestinal:  Negative for abdominal pain and vomiting.  Genitourinary:  Negative for dysuria and hematuria.  Musculoskeletal:  Negative for arthralgias and back pain.  Skin:  Negative for color change and rash.  Neurological:  Negative for seizures and syncope.  All other systems reviewed and are negative.    Physical Exam Triage Vital Signs ED Triage Vitals  Encounter Vitals Group     BP 06/11/23 1549 118/71     Systolic BP Percentile --      Diastolic BP Percentile --      Pulse Rate 06/11/23 1549 97     Resp 06/11/23 1549 18     Temp 06/11/23 1549 98.3 F (36.8 C)     Temp src --      SpO2 06/11/23 1549 94 %     Weight --  Height --      Head Circumference --      Peak Flow --      Pain Score 06/11/23 1548 0     Pain Loc --      Pain Education --      Exclude from Growth Chart --    No data found.  Updated Vital Signs BP 118/71   Pulse 97   Temp 98.3 F (36.8 C)   Resp 18   SpO2 94%   Visual Acuity Right Eye Distance:   Left Eye Distance:   Bilateral Distance:    Right Eye Near:   Left Eye Near:    Bilateral Near:     Physical Exam Vitals and nursing note reviewed.  Constitutional:      General: He is not in acute distress.    Appearance: He is well-developed.  HENT:     Head: Normocephalic and atraumatic.     Right Ear: There is impacted cerumen.     Left Ear: There is impacted cerumen.     Nose: Nose normal.     Mouth/Throat:     Mouth: Mucous membranes are moist.  Eyes:     Conjunctiva/sclera: Conjunctivae  normal.  Cardiovascular:     Rate and Rhythm: Normal rate and regular rhythm.     Heart sounds: No murmur heard. Pulmonary:     Effort: Pulmonary effort is normal. No respiratory distress.     Breath sounds: Normal breath sounds.  Abdominal:     Palpations: Abdomen is soft.     Tenderness: There is no abdominal tenderness.  Musculoskeletal:        General: No swelling.     Cervical back: Neck supple.  Skin:    General: Skin is warm and dry.     Capillary Refill: Capillary refill takes less than 2 seconds.  Neurological:     Mental Status: He is alert.  Psychiatric:        Mood and Affect: Mood normal.      UC Treatments / Results  Labs (all labs ordered are listed, but only abnormal results are displayed) Labs Reviewed - No data to display  EKG   Radiology No results found.  Procedures Procedures (including critical care time)  Medications Ordered in UC Medications - No data to display  Initial Impression / Assessment and Plan / UC Course  I have reviewed the triage vital signs and the nursing notes.  Pertinent labs & imaging results that were available during my care of the patient were reviewed by me and considered in my medical decision making (see chart for details).     Bilateral impacted cerumen  Viral upper respiratory tract infection with cough   Symptoms are most consistent with a viral infection.  This does not require antibiotic treatment.  We focus treatment on improving the symptoms.  Continue to use over-the-counter medications such as Tylenol , over-the-counter cough medication or cold and flu medication.  As the symptoms are improving, no further intervention needed.  On physical exam we did find that both ears were impacted and irrigation was done today.  Can try using over-the-counter Debrox to help reduce earwax buildup. Return to urgent care or PCP if symptoms worsen or fail to resolve.    Final Clinical Impressions(s) / UC Diagnoses   Final  diagnoses:  Bilateral impacted cerumen  Viral upper respiratory tract infection with cough   Discharge Instructions   None    ED Prescriptions   None  PDMP not reviewed this encounter.   Kreg Pesa, [name] 06/11/23 1638

## 2023-09-13 ENCOUNTER — Ambulatory Visit (HOSPITAL_COMMUNITY)
Admission: EM | Admit: 2023-09-13 | Discharge: 2023-09-13 | Disposition: A | Discharge status: Home / Self Care | Attending: Family Medicine | Admitting: Family Medicine

## 2023-09-13 ENCOUNTER — Other Ambulatory Visit: Payer: Self-pay

## 2023-09-13 ENCOUNTER — Encounter (HOSPITAL_COMMUNITY): Payer: Self-pay | Admitting: Emergency Medicine

## 2023-09-13 DIAGNOSIS — M79672 Pain in left foot: Secondary | ICD-10-CM | POA: Diagnosis not present

## 2023-09-13 DIAGNOSIS — M79671 Pain in right foot: Secondary | ICD-10-CM | POA: Diagnosis not present

## 2023-09-13 MED ORDER — DEXAMETHASONE SODIUM PHOSPHATE 10 MG/ML IJ SOLN
10.0000 mg | Freq: Once | INTRAMUSCULAR | Status: AC
  Administered 2023-09-13: 10 mg via INTRAMUSCULAR

## 2023-09-13 MED ORDER — DEXAMETHASONE SODIUM PHOSPHATE 10 MG/ML IJ SOLN
INTRAMUSCULAR | Status: AC
  Filled 2023-09-13: qty 1

## 2023-09-13 NOTE — Discharge Instructions (Addendum)
 You may use over the counter Domeboro solution as directed on the box.  Meds ordered this encounter  Medications   dexamethasone  (DECADRON ) injection 10 mg

## 2023-09-13 NOTE — ED Triage Notes (Signed)
 Patient has red, raw feet, particularly about the toes.  Reportedly noticed 2 days ago.  Has used a/d ointment.  Has used another type of ointment.  Soaked feet in epsom salt and has used baking soda soaks as well

## 2023-09-13 NOTE — ED Notes (Signed)
No answer in waiting area.
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
## 2024-01-03 IMAGING — DX DG KNEE COMPLETE 4+V*R*
4 series · 4 of 4 positions shown · non-contrast
Comparison: None Available.

CLINICAL DATA: Right knee pain after injury yesterday.

EXAM:
RIGHT KNEE - COMPLETE 4+ VIEW

[knee ap]
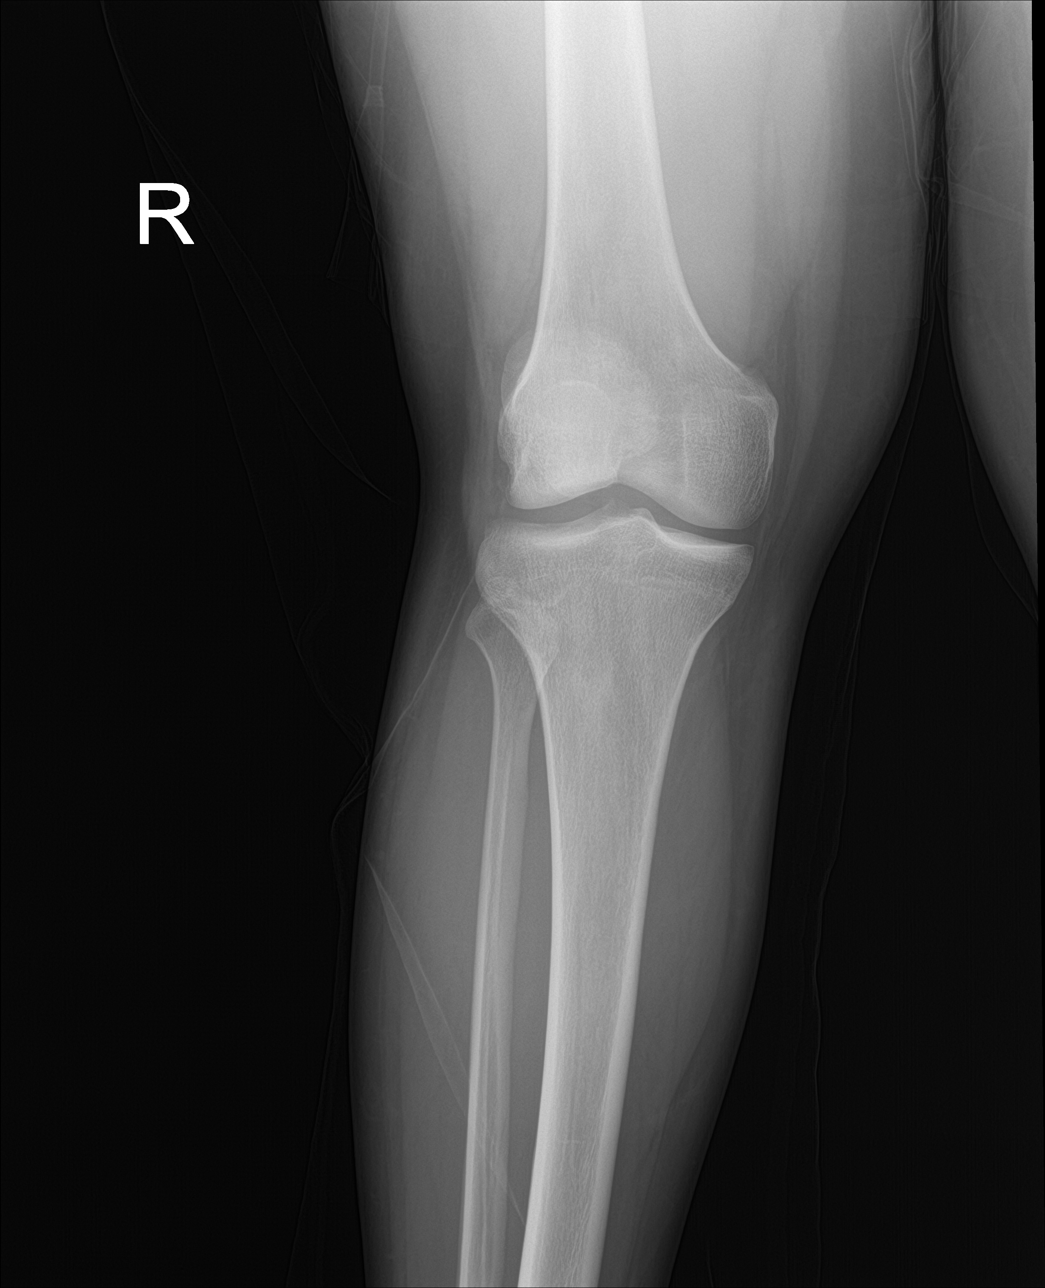

[knee obl (1 of 2)]
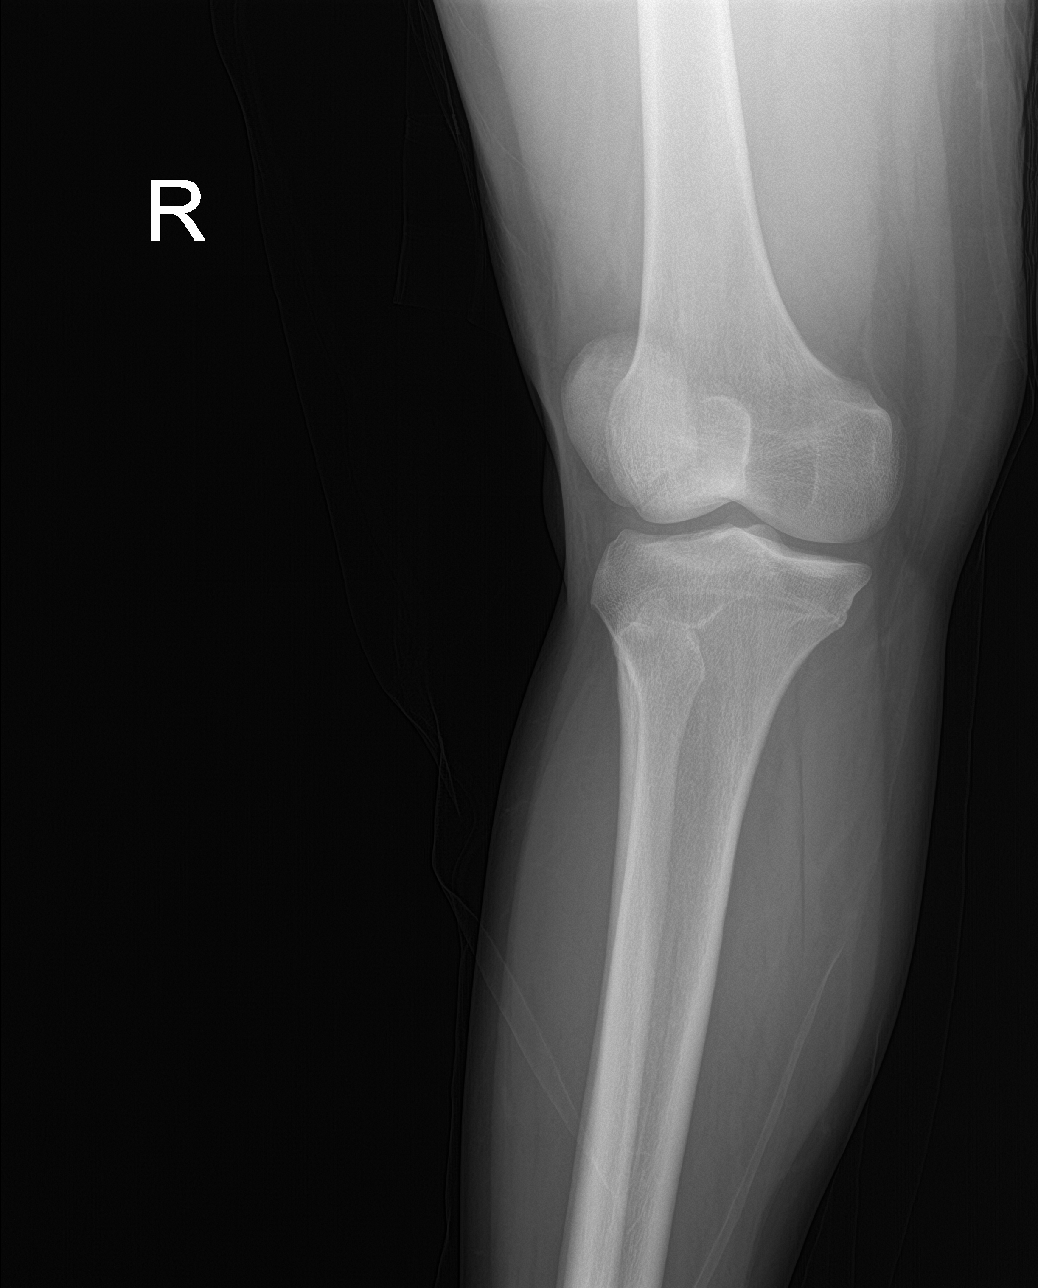

[knee obl (2 of 2)]
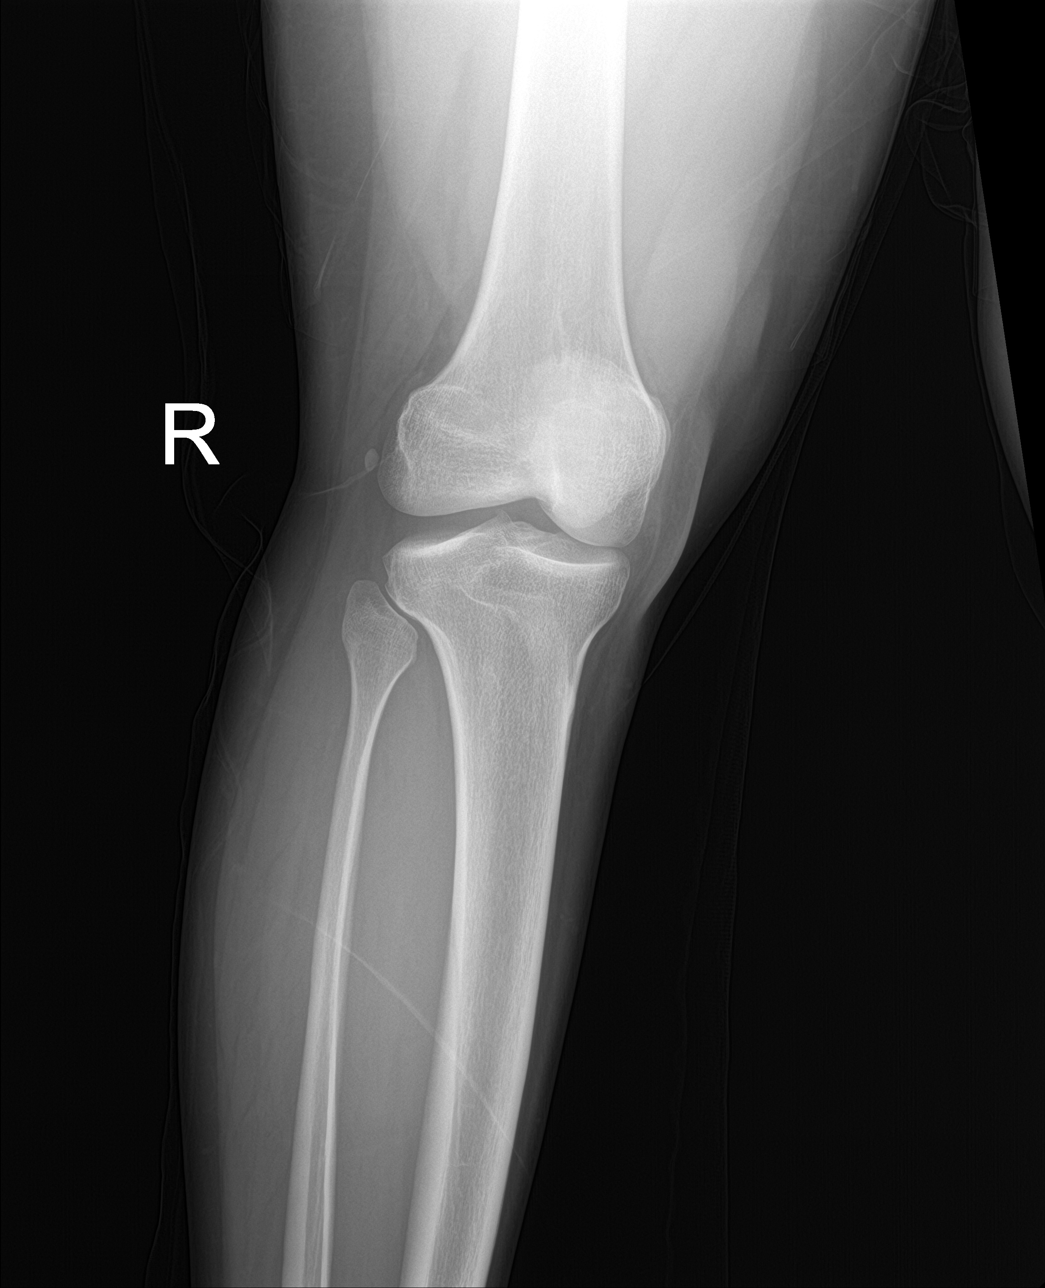

[knee lat]
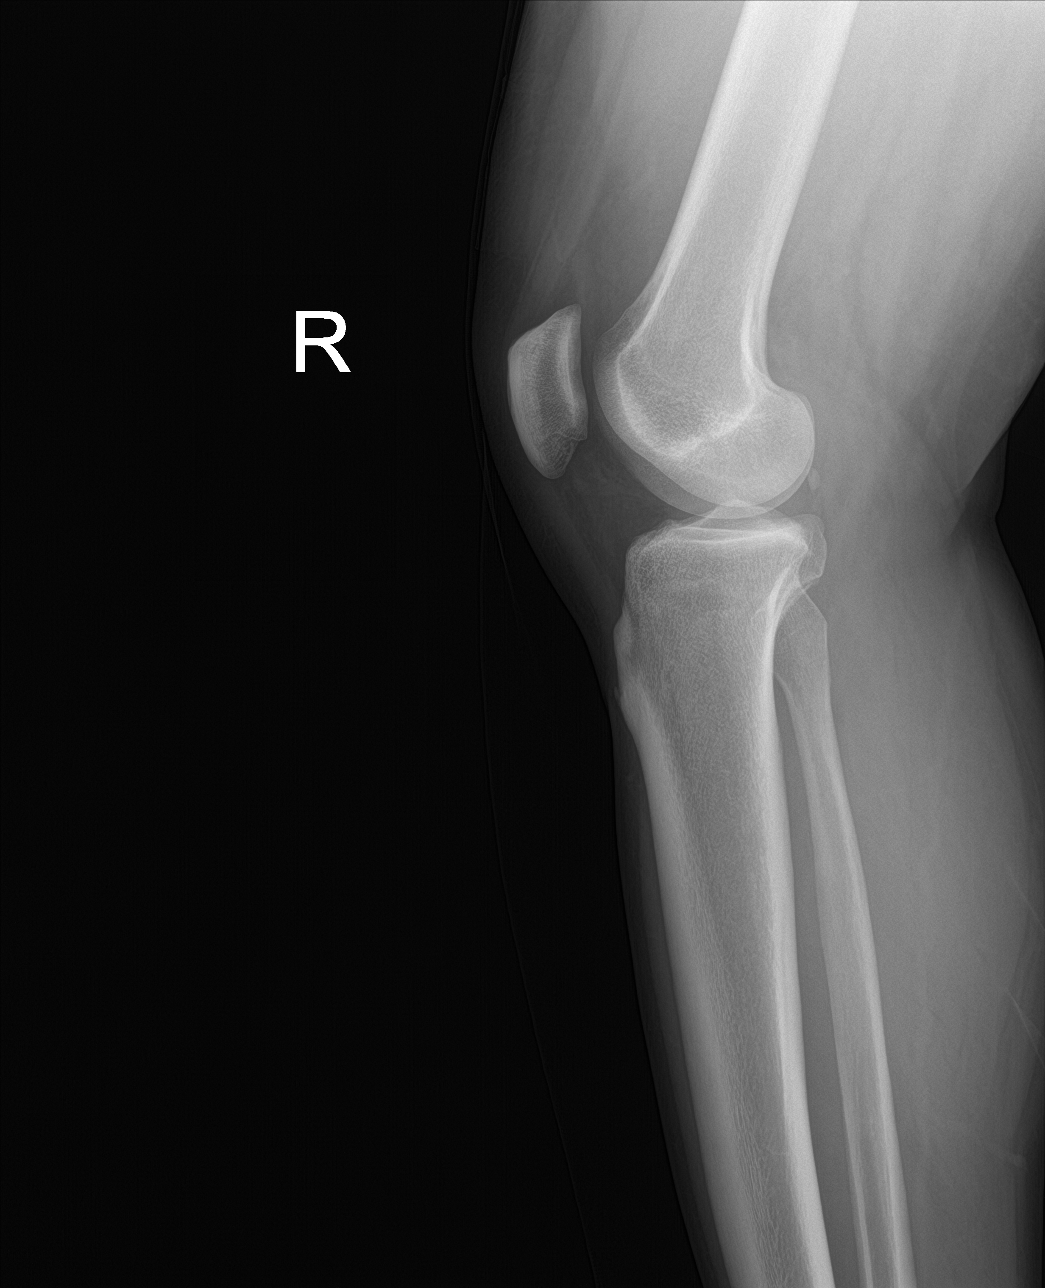

[4 of 4 positions shown; findings below may reference images not displayed]

FINDINGS: No evidence of fracture, dislocation, or joint effusion. No evidence
of arthropathy or other focal bone abnormality. Soft tissues are
unremarkable.
IMPRESSION: Negative.
